# Patient Record
Sex: Male | Born: 1950 | Race: White | Hispanic: No | Marital: Married | Smoking: Former smoker
Health system: Southern US, Community
[De-identification: ages and names within clinical notes are randomized; demographics above are authoritative.]

## PROBLEM LIST (undated history)

## (undated) DIAGNOSIS — I2510 Atherosclerotic heart disease of native coronary artery without angina pectoris: Secondary | ICD-10-CM

## (undated) DIAGNOSIS — E119 Type 2 diabetes mellitus without complications: Secondary | ICD-10-CM

## (undated) DIAGNOSIS — I219 Acute myocardial infarction, unspecified: Secondary | ICD-10-CM

## (undated) HISTORY — PX: CORONARY STENT PLACEMENT: SHX1402

## (undated) HISTORY — PX: TONSILLECTOMY: SUR1361

## (undated) HISTORY — PX: HERNIA REPAIR: SHX51

## (undated) HISTORY — PX: APPENDECTOMY: SHX54

---

## 2000-06-26 ENCOUNTER — Encounter: Admission: RE | Admit: 2000-06-26 | Discharge: 2000-06-26 | Payer: Self-pay | Admitting: Family Medicine

## 2000-06-26 ENCOUNTER — Encounter: Payer: Self-pay | Admitting: Family Medicine

## 2000-06-27 ENCOUNTER — Encounter: Payer: Self-pay | Admitting: Family Medicine

## 2000-06-27 ENCOUNTER — Encounter: Admission: RE | Admit: 2000-06-27 | Discharge: 2000-06-27 | Payer: Self-pay | Admitting: Family Medicine

## 2002-08-13 ENCOUNTER — Emergency Department (HOSPITAL_COMMUNITY): Admission: EM | Admit: 2002-08-13 | Discharge: 2002-08-13 | Payer: Self-pay | Admitting: Emergency Medicine

## 2002-08-13 ENCOUNTER — Encounter: Payer: Self-pay | Admitting: Emergency Medicine

## 2003-10-09 ENCOUNTER — Encounter (HOSPITAL_COMMUNITY): Admission: RE | Admit: 2003-10-09 | Discharge: 2003-11-08 | Payer: Self-pay | Admitting: Preventative Medicine

## 2006-01-22 ENCOUNTER — Inpatient Hospital Stay (HOSPITAL_COMMUNITY): Admission: EM | Admit: 2006-01-22 | Discharge: 2006-01-26 | Payer: Self-pay | Admitting: Emergency Medicine

## 2006-03-01 ENCOUNTER — Inpatient Hospital Stay (HOSPITAL_COMMUNITY): Admission: RE | Admit: 2006-03-01 | Discharge: 2006-03-04 | Payer: Self-pay | Admitting: Cardiology

## 2007-05-10 ENCOUNTER — Encounter: Admission: RE | Admit: 2007-05-10 | Discharge: 2007-05-10 | Payer: Self-pay | Admitting: Family Medicine

## 2007-08-23 ENCOUNTER — Ambulatory Visit (HOSPITAL_COMMUNITY): Admission: RE | Admit: 2007-08-23 | Discharge: 2007-08-23 | Payer: Self-pay | Admitting: Cardiology

## 2007-10-29 ENCOUNTER — Encounter: Admission: RE | Admit: 2007-10-29 | Discharge: 2007-10-29 | Payer: Self-pay | Admitting: Surgery

## 2007-10-30 ENCOUNTER — Ambulatory Visit (HOSPITAL_BASED_OUTPATIENT_CLINIC_OR_DEPARTMENT_OTHER): Admission: RE | Admit: 2007-10-30 | Discharge: 2007-10-30 | Payer: Self-pay | Admitting: Surgery

## 2008-03-30 ENCOUNTER — Encounter: Admission: RE | Admit: 2008-03-30 | Discharge: 2008-03-30 | Payer: Self-pay | Admitting: Plastic Surgery

## 2008-11-26 ENCOUNTER — Encounter: Admission: RE | Admit: 2008-11-26 | Discharge: 2008-11-26 | Payer: Self-pay | Admitting: Sports Medicine

## 2009-08-03 ENCOUNTER — Inpatient Hospital Stay (HOSPITAL_BASED_OUTPATIENT_CLINIC_OR_DEPARTMENT_OTHER): Admission: RE | Admit: 2009-08-03 | Discharge: 2009-08-03 | Payer: Self-pay | Admitting: Cardiology

## 2010-01-12 ENCOUNTER — Encounter: Admission: RE | Admit: 2010-01-12 | Discharge: 2010-01-12 | Payer: Self-pay | Admitting: Orthopaedic Surgery

## 2010-06-05 ENCOUNTER — Encounter: Payer: Self-pay | Admitting: Cardiology

## 2010-06-23 ENCOUNTER — Other Ambulatory Visit: Payer: Self-pay | Admitting: Surgery

## 2010-06-23 DIAGNOSIS — R102 Pelvic and perineal pain: Secondary | ICD-10-CM

## 2010-06-27 ENCOUNTER — Other Ambulatory Visit: Payer: Self-pay

## 2010-06-27 ENCOUNTER — Ambulatory Visit
Admission: RE | Admit: 2010-06-27 | Discharge: 2010-06-27 | Disposition: A | Discharge status: Home / Self Care | Payer: Self-pay | Source: Ambulatory Visit | Attending: Surgery | Admitting: Surgery

## 2010-06-27 DIAGNOSIS — R102 Pelvic and perineal pain: Secondary | ICD-10-CM

## 2010-06-27 MED ORDER — IOHEXOL 300 MG/ML  SOLN
125.0000 mL | Freq: Once | INTRAMUSCULAR | Status: AC | PRN
  Administered 2010-06-27: 125 mL via INTRAVENOUS

## 2010-09-27 NOTE — Op Note (Signed)
NAME:  Jackson Porter, Jackson Porter         ACCOUNT NO.:  [id number]   MEDICAL RECORD NO.:  [id number]          PATIENT TYPE:  AMB   LOCATION:  DSC                          FACILITY:  [facility]   PHYSICIAN:  Wilmon Arms. Corliss Skains, M.D. DATE OF BIRTH:  09-Mar-1951   DATE OF PROCEDURE:  10/30/2007  DATE OF DISCHARGE:                               OPERATIVE REPORT   PREOPERATIVE DIAGNOSES:  1. Right inguinal hernia.  2. Umbilical hernia.   POSTOPERATIVE DIAGNOSES:  1. Right inguinal hernia.  2. Umbilical hernia.   PROCEDURE PERFORMED:  1. Right inguinal hernia repair with mesh.  2. Umbilical hernia repair with mesh.   SURGEON:  Wilmon Arms. Corliss Skains, MD, FACS   ANESTHESIA:  General via LMA.   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  She presents with a one-year  history of pain and discomfort in his right groin.  His pain has become  more uncomfortable over the last few months.  His primary care doctor  obtained a CT scan of the pelvis in December 2008.  This showed an  umbilical hernia containing fat and a small right inguinal hernia  containing only fat.  The patient has no obstructive symptoms.  She  presents now for elective repair.   DESCRIPTION OF PROCEDURE:  The patient is brought to the operating room  and placed in a supine position on the operating room table.  After an  adequate level of general anesthesia was obtained, the periumbilical  region and right groin was shaved, prepped with Betadine and draped in  sterile fashion.  Time-out was taken to assure the proper patient and  proper procedure.  A skin mark was used to outline an incision above the  right inguinal ligament.  This area was infiltrated with 0.25% Marcaine  with epinephrine.  An incision was made and dissection was carried down  to the external oblique fascia.  The external oblique fascia was opened  along the direction of its fibers down to the  external ring.  We bluntly  dissected around the spermatic cord.  The floor of the inguinal canal  showed a moderate size direct hernia.  This was easily reduced.  I  skeletonized the spermatic cord and a small indirect hernia sac was  dissected free and reduced up to the internal ring.  The internal ring  was tightened with a 2-0 Vicryl.  The floor of the inguinal canal was  reapproximated with a 0 PDS suture.  A keyhole mesh was cut from a 3  inch x 6 inch piece of UltraPro mesh.  This was secured beginning at the  pubic tubercle with 2-0 Prolene suture.  Interrupted sutures were used  to attach the mesh to the shelving edge and the internal oblique fascia.  The tails were sutured together behind the spermatic cord and tucked  underneath the external oblique fascia.  The fascia was reapproximated  with 2-0 Vicryl.  3-0 Vicryl was used to close the subcutaneous tissues,  1-0 Monocryl was used to close the skin.  We then covered this  incision  with a towel.  I changed gloves.  We infiltrated the periumbilical  region with 0.25% Marcaine with epinephrine.  I made a transverse  incision above the umbilicus.  Dissection was carried down to the fascia  with cautery.  The umbilical hernia defect was directly behind the  umbilicus and the hernia sac was densely adherent to the posterior  surface of the umbilical dermis.  I had to excise a small amount of  umbilical skin along with part of the hernia sac.  The hernia was  reduced back to the preperitoneal space.  The fascia was cleared for  about a centimeter and half in all directions.  A small Ventralex mesh  was inserted into the preperitoneal space and secured with 4 interrupted  0 Prolene sutures.  The fascia was reapproximated anterior to the mesh  with figure-of-eight 0 Prolene.  3-0 Vicryl was used to tack down the  base of the umbilicus to the fascia.  3-0 Vicryl was used to close the  subcutaneous tissues and 4-0 Monocryl was used to  close the skin.  Steri-  Strips and clean dressings were applied.  The patient was extubated and  brought to the recovery room in stable condition.  All sponge,  instrument, and needle counts were correct.      Wilmon Arms. Tsuei, M.D.  Electronically Signed     MKT/MEDQ  D:  10/30/2007  T:  10/31/2007  Job:  [id number]   cc:   L. Lupe Carney, M.D.

## 2010-09-30 NOTE — Cardiovascular Report (Signed)
NAME:  Jackson Porter, Jackson Porter NO.:  [id number]   MEDICAL RECORD NO.:  [id number]          PATIENT TYPE:  INP   LOCATION:  2032                         FACILITY:  [facility]   PHYSICIAN:  Eduardo Osier. Sharyn Lull, M.D. DATE OF BIRTH:  May 20, 1950   DATE OF PROCEDURE:  01/22/2006  DATE OF DISCHARGE:                              CARDIAC CATHETERIZATION   PROCEDURES:  1. Left cardiac catheterization, selective left and right coronary      angiography, left ventriculography via right groin using Judkins      technique.  2. Successful percutaneous transluminal coronary angioplasty to      posterolateral branch of the right coronary artery using 2.5 x 12-mm      long Voyager balloon.  3. Successful percutaneous transluminal coronary angioplasty to the mid      left anterior descending artery using 2 x 12-mm long Voyager balloon      and then 2.5 x 12-mm long Voyager balloon.  4. Successful deployment of 2.5 x 28-mm long CYPHER drug-eluting stent in      mid left anterior descending artery.  5. Successful post dilatation of this CYPHER drug-eluting stent using 2.75      x 13-mm long Powersail balloon.  6. Successful deployment of 3 x 13-mm long CYPHER drug-eluting stent in      the proximal left anterior descending artery.   INDICATIONS FOR THE PROCEDURES:  Jackson Porter is a 60 year old white  male with past medical history significant for tobacco abuse.  He came to  the ER via EMS complaining of retrosternal chest pain, rated 10/10,  radiating to his left arm, associated with nausea.  Received sublingual  nitro with partial relief associated with mild shortness of breath.  States  had similar chest pain last week lasting half an hour relieved with rest and  did not seek medical attention.  EKG done in the field showed normal sinus  rhythm with a right bundle branch block pattern with left anterior  fascicular block and Q waves in the inferior leads with ST elevation in  inferior  leads and also lateral poor R wave progression.  Due to typical  anginal chest pain, EKG changes, I spoke to the patient and his wife  regarding emergency left cath, possible PTCA stenting and consented for the  procedure.   PROCEDURE:  After obtained the informed consent, the patient was brought to  the cath lab and was placed on the fluoroscopic table.  Right groin was  prepped and draped in the usual fashion.  Xylocaine 2% was used for local  anesthesia in the right groin.  With the help of a thin-walled needle, a 6-  French arterial sheath was placed.  The sheath was aspirated and flushed.  Next, a 6-French left Judkins catheter was advanced over the wire and under  fluoroscopic guidance up to the ascending aorta.  The wire was pulled out.  The catheter was aspirated and connected to the manifold.  The catheter was  further advanced and engaged into left coronary ostium.  Multiple views of  the left system were taken.  Next, the catheter was disengaged and was  pulled out over the wire and was replaced with a 6-French right Judkins  catheter which was advanced over the wire under fluoroscopic guidance to the  ascending aorta. The wire was pulled out. The catheter was aspirated and  connected to the manifold.  The catheter was further advanced and engaged  into the right coronary ostium.  Multiple views of the right system were  taken.  The catheter was disengaged and was pulled out over the wire and was  replaced with 6-French pigtail catheter which was advanced over the wire  under fluoroscopic guidance up to the ascending aorta.  The catheter was  further advanced across the aortic valve into the LV.  LV pressures were  recorded.  LV graft was done in 30 degree RAO position.  Post angiographic  pressures were recorded from LV and then pullback pressures were recorded  from aorta.  There is no gradient across the aortic valve.  The pigtail  catheter was pulled out over the wire.   Sheaths were aspirated and flushed.   FINDINGS:  1. Left ventricle showed inferior wall hypokinesia.  2. Ejection fraction of 50-55%.  3. The left main was patent.  4. The left anterior descending artery had 70% proximal stenosis, and 99%      mid stenosis, and 100% occluded distally which was filling by      collaterals from posterior descending artery.  5. Diagonal-1 was very small.  6. Diagonal-2 was large with end stent to 15% ostial stenosis.  7. The left circumflex has 10-15% proximal stenosis and 50-60% diffuse      disease distally beyond the obtuse marginal-3.  8. Obtuse marginal-1 is very, very small.  9. Obtuse marginal-2 is small which is patent.  10.Obtuse marginal-3 has 90% ostial bifurcation stenosis with distal left      circumflex.  11.Right coronary artery has 40-50% proximal stenosis, and 30% mid      stenosis, and 90-95% stenosis in the posterior left ventricular branch      with haziness which was a culprit lesion for her acute myocardial      infarction.  12.The posterior descending artery was patent proximally which was      providing collaterals to recently occluded left anterior descending      artery.   INTERVENTIONAL PROCEDURES:  1. Successful PTCA to the LV branch of RCA was done using a 2.5 x 12-mm      long Voyager balloon, multiple inflations were done going up to 11      atmospheres pressure.  Lesion was dilated from 90-95% to less than 10%      residual with excellent TIMI grade III distal flow without evidence of      dissection or distal embolization.  2. Successful PTCA of the mid LAD was done using 2 x 12-mm long Voyager      balloon and then 2.5 x 12-mm long Voyager balloon for pre-dilatation,      and then 2.5 x 28-mm long CYPHER drug-eluting stent was deployed at 14      atmospheres of pressure which was fully expanded using 2.75 x 13-mm      long Powersail balloon going up to 18 atmospheres of pressure. 3. Successful direct stenting of the  proximal LAD was done using 3 x 13-mm      long CYPHER drug-eluting stent which was deployed at 11 atmospheres of      pressure which was fully expanded going up to 18 atmospheres of  pressure.  The lesion in the proximal LAD was dilated from 70% to 0%      residual with excellent TIMI grade III distal flow without evidence of      dissection or distal embolization.  4. The patient received weight based heparin, Integrilin, 600 mg of Plavix      during the procedure.  The patient tolerated the procedure well.  There      were no complications.  5. Mid LAD was dilated from 99% to 0% ostial with normalization of distal      flow.           ______________________________  Eduardo Osier Sharyn Lull, M.D.     MNH/MEDQ  D:  01/26/2006  T:  01/27/2006  Job:  [id number]   cc:   Cath Lab

## 2010-09-30 NOTE — Discharge Summary (Signed)
NAME:  Jackson Porter, Jackson Porter         ACCOUNT NO.:  [id number]   MEDICAL RECORD NO.:  [id number]          PATIENT TYPE:  OIB   LOCATION:  3743                         FACILITY:  [facility]   PHYSICIAN:  Mohan N. Sharyn Lull, M.D. DATE OF BIRTH:  November 01, 1950   DATE OF ADMISSION:  03/01/2006  DATE OF DISCHARGE:  03/04/2006                                 DISCHARGE SUMMARY   ADMISSION DIAGNOSES:  1. New onset angina.  2. Coronary artery disease.  3. History of inferior wall and anteroseptal wall myocardial infarction in      the past, status post percutaneous coronary intervention to right      coronary artery and left anterior descending.  4. Multivessel coronary artery disease.  5. Hypertension.  6. Glucose intolerance.  7. Hypercholesteremia.  8. History of tobacco abuse.  9. Resolving thrombocytopenia.   DISCHARGE DIAGNOSES:  1. New onset angina status post percutaneous transluminal coronary      intervention and stenting to left circumflex/ostial obtuse marginal 3.  2. Coronary artery disease status post recent inferior wall myocardial      infarction, status post inferior and anteroseptal wall myocardial      infarction, status post percutaneous coronary intervention to right      coronary artery and left anterior descending.  3. Hypertension.  4. New onset diabetes mellitus controlled by diet.  5. Hypercholesteremia.  6. History of tobacco abuse.  7. Resolving thrombocytopenia.   DISCHARGE MEDICATIONS:  1. Enteric-coated aspirin 325 mg 1 tablet daily for 1 month and then 81 mg      1 tablet daily.  2. Plavix 75 mg 1 tablet daily with food.  3. Toprol-XL 25 mg 1 tablet daily.  4. Altace 2.5 mg 1 capsule daily.  5. Lipitor 40 mg 1 tablet daily.  6. Niaspan 500 mg 2 tablets daily at night.  7. Imdur 30 mg 1 tablet daily in the morning.  8. Nitrostat 0.4 mg sublingual, use as directed.  9. Protonix 40 mg 1 tablet daily.  10.Chantix 1 mg twice daily.   DIET:  Low salt, low  cholesterol, 1800 calorie ADA diet.  The patient has  been advised to avoid sweets.   WOUND CARE:  Post PTCA stent instructions have been given.   ACTIVITY:  The patient has been advised to avoid lifting, pushing or pulling  for 48 hours.   FOLLOW UP:  Follow-up with me in 1 week.   CONDITION AT DISCHARGE:  Stable.   The patient is scheduled for phase II cardiac rehab as outpatient.   BRIEF HISTORY AND HOSPITAL COURSE:  Jackson Porter is a 60 year old white  male with past medical history significant for multivessel coronary artery  disease status post inferior and anteroseptal wall MI requiring PCI to RCA  and 100% occluded LAD, hypertension, glucose intolerance,  hypercholesteremia, history of tobacco abuse.  He was recently discharged  from the hospital status post MI.  He complains of vague retrosternal chest  pain off and on without associated symptoms.  Denies any palpitation,  lightheadedness, or syncope.  Denies PND, orthopnea, leg swelling.  States  that he has not taken any sublingual  nitro since hospital discharge,  although his activity is very limited.   PAST MEDICAL HISTORY:  As above.   PAST SURGICAL HISTORY:  He had appendectomy in the past and tonsillectomy as  a child.   ALLERGIES:  NO KNOWN DRUG ALLERGIES.   MEDICATIONS AT HOME:  1. Enteric-coated aspirin 325 mg daily.  2. Plavix 75 mg p.o. daily.  3. Toprol-XL 25 mg p.o. daily.  4. Altace 2.5 mg p.o. daily.  5. Lipitor 40 mg p.o. daily.  6. Niaspan 500 mg two p.o. q.h.s.  7. Imdur 30 mg p.o. q.a.m.  8. Nitrostat sublingual p.r.n.  9. Chantix 1 mg p.o. b.i.d..   SOCIAL HISTORY:  He is married and has 2 children.  He smoked one pack per  day for 35-40 years, quit after MI just recently.  No history of alcohol  abuse.  He works as an Journalist, newspaper.   FAMILY HISTORY:  Negative for coronary artery disease.   PHYSICAL EXAMINATION:  GENERAL:  He is alert and oriented x3 in no acute  distress.  VITAL  SIGNS:  Blood pressure was 110/76, pulse was 78 and regular.  HEENT:  Conjunctivae was pink.  NECK:  Supple.  No JVD, no bruit.  LUNGS:  Clear to auscultation without rhonchi or rales.  CARDIOVASCULAR:  S1 and S2 was normal.  There was a soft systolic murmur at  the apex.  There was no S3 gallop or rub.  ABDOMEN:  Soft.  Bowel sounds  were present.  Nontender.  EXTREMITIES:  There is no clubbing, cyanosis or edema.   LABORATORY DATA:  EKG showed normal sinus rhythm, right bundle branch block,  old inferior wall MI.  Post-PCI, the EKG showed no acute ischemic changes.   Hemoglobin was 15.4, hematocrit 45.2, white count 8.7, platelet count was  137,000.  His potassium was 4.2, glucose 116, BUN 11, creatinine 1.04.  Post-  PTCA, his CPK was normal at 160, MB of 4.0.  Repeat platelet count was  134,000 which has been stable.  On March 03, 2006, his CPK and CPK-MB were  normal, but troponin I was slightly elevated which was 0.09.  Today's  troponin I is 0.07, CPK-MB is within normal range.   BRIEF HOSPITAL COURSE:  The patient was admitted and underwent left cardiac  cath with selective left and right coronary angiography and PTCA and  stenting to the mid-left circumflex and ostial OM3, as per procedure report.  The patient tolerated the procedure well.  The patient did have calcified  lesion in the mid-left circumflex and takeoff of OM3 was at the right angle  to obtuse angle and had difficulty  tracking the stent down into OM3.   The patient had one episode of mild vague chest discomfort during the  hospital stay.  Repeat EKG showed no acute ischemic changes.  CPK-MB, three  sets have been normal, although troponin I is minimally elevated which has  been trending down.  The patient has been ambulating in hallway without any  problems.  His groin is stable with no evidence of hematoma or bruit.  The patient is off of nitrates for the last 24 hours.  The patient did not have  any more  episodes of chest pain during the hospital stay and will be  discharged home on above medications.  He will be followed up in my office  in 1 week.           ______________________________  Eduardo Osier. Sharyn Lull, M.D.  MNH/MEDQ  D:  03/04/2006  T:  03/05/2006  Job:  [id number]

## 2010-09-30 NOTE — Discharge Summary (Signed)
NAME:  Porter, Jackson NO.:  [id number]   MEDICAL RECORD NO.:  [id number]          PATIENT TYPE:  INP   LOCATION:  2032                         FACILITY:  [facility]   PHYSICIAN:  Eduardo Osier. Sharyn Lull, M.D. DATE OF BIRTH:  Sep 07, 1950   DATE OF ADMISSION:  01/22/2006  DATE OF DISCHARGE:  01/26/2006                                 DISCHARGE SUMMARY   ADMITTING DIAGNOSES:  1. Acute inferior wall myocardial infarction.  2. Tobacco abuse.   FINAL DIAGNOSES:  1. Acute inferior wall myocardial infarction status post percutaneous      coronary intervention to the left ventricular branch of right coronary      artery, status post recent anteroseptal wall myocardial infarction,      status post percutaneous transluminal coronary angioplasty stenting to      left anterior descending, proximal and mid left anterior descending.  2. Hypertension.  3. Glucose intolerance.  4. Hypercholesteremia.  5. Tobacco abuse.  6. Hypercholesteremia, resolving.  7. Thrombocytopenia.   DISCHARGE HOME MEDICATIONS:  1. Enteric-coated aspirin 325 mg 1 tablet daily.  2. Plavix 75 mg 1 tablet daily with food.  3. Toprol-XL 25 mg tablet daily.  4. Altace 2.5 mg 1 capsule daily.  5. Lipitor 40 mg 1 tablet daily.  6. Niaspan 500 mg 2 tablets daily at night.  7. Imdur 30 mg 1 tablet daily in the morning.  8. Nitrostat 0.4 mg sublingual use as directed.  9. Chantix 1 mg half tablet twice daily for one week and then 1 mg twice      daily.   DIET:  Low salt, low cholesterol.  The patient has been advised to avoid  sweets.   DISCHARGE INSTRUCTIONS:  Post PTCA and stent instructions have been given.   FOLLOWUP:  Follow up with me in one week.   CONDITION AT DISCHARGE:  Stable   BRIEF HISTORY AND HOSPITAL COURSE:  [name] is a 60 year old white  male with a past medical history significant for just tobacco abuse.  He  came to the ER via EMS complaining of retrosternal chest pain grade  10/10,  radiating to the left arm associated with nausea, received sublingual nitro  with partial relief associated with also mild shortness of breath.  States  he had similar chest pain last week lasting a half an hour, relieved with  rest, and did not seek any medical attention.  EKG done in the field showed  normal sinus rhythm with right bundle branch block, left anterior fascicular  block, Q-wave inferior leads with ST elevation in inferior leads and lateral  poor R-wave progression.  Due to typical anginal chest pain, EKG changes,  discussed with the patient and his wife regarding emergency cath, possible  PTCA and PTCA stenting, __________ and consented for the procedure.   PAST MEDICAL HISTORY:  As above.   PAST SURGICAL HISTORY:  Had appendectomy in the past and tonsillectomy in  the past.   SOCIAL HISTORY:  He is married and has 2 children.  Smoked one pack per day  for 35-40 years.  No history of alcohol abuse.  He works as an  Psychologist, prison and probation services.   FAMILY HISTORY:  Negative for coronary artery disease.   ALLERGIES:  None.   MEDICATION:  Advil occasionally.   PHYSICAL EXAMINATION:  GENERAL:  He was alert and oriented x3, in no acute  distress.  VITAL SIGNS:  Blood pressure was 124/70, pulse was 76.  HEENT:  Conjunctivae was pink.  NECK:  Supple, no JVD, no bruit.  LUNGS:  Decreased breath sounds at bases with occasional rhonchi.  CARDIOVASCULAR:  S1, S2 was normal.  There was a soft systolic murmur.  There was no S3 gallop.  There was a soft S4 gallop.  ABDOMEN:  Soft.  Bowel sounds present, nontender.  EXTREMITIES: There is no clubbing, cyanosis or edema.   LABORATORIES:  His cholesterol was 188, HDL was low at 29, LDL 132.  Cardiac  enzymes:  CK first set was 260, MB 4.1, second set CK 249, MB 9.1, relative  index 3.7, third set CK 305, MB 13.7, relative index 4.54, fourth set CK  208, MB 7.5, relative index 3.7.  Today's CK is 110, MB 2.1.  Troponin I was  0.02,  0.41, 0.64, 0.40 and 0.19.  His sodium was 139, potassium 3.7,  chloride 109, bicarb 24, glucose was 138.  Repeat fasting blood sugar was  106.  BUN 11, creatinine 1.2.  Hemoglobin was 15.3, hematocrit 45, white  count of 8.7.  His hemoglobin A1c was 5.8.   BRIEF HISTORY AND HOSPITAL COURSE:  The patient was directly taken to the  cath lab and underwent emergency left cardiac cath with select left and  right coronary angiography and PTCA and stenting to the LV branch of RCA and  LAD and PTCA stenting to LAD as per procedure report.  The patient tolerated  the procedure well.  There were no complications.  The patient did not have  any further episodes of chest pain during the hospital stay.  Phase one  cardiac rehab was called.  The patient has been ambulating in the hallway  without any problems.  The patient will be discharged home on the above  medications and will be followed up in my office in one week.  We will  schedule him for elective PCI to left circumflex OM-3 as outpatient in a few  weeks.           ______________________________  Eduardo Osier Sharyn Lull, M.D.     MNH/MEDQ  D:  01/26/2006  T:  01/27/2006  Job:  [id number]

## 2010-09-30 NOTE — Cardiovascular Report (Signed)
NAME:  Jackson Porter, Jackson Porter         ACCOUNT NO.:  [id number]   MEDICAL RECORD NO.:  [id number]          PATIENT TYPE:  OIB   LOCATION:  6526                         FACILITY:  [facility]   PHYSICIAN:  Mohan N. Sharyn Lull, M.D. DATE OF BIRTH:  October 25, 1950   DATE OF PROCEDURE:  03/01/2006  DATE OF DISCHARGE:                              CARDIAC CATHETERIZATION   PROCEDURE:  1. Left cardiac cath with selective left and right coronary angiography,      measurement of left ventricular end diastolic pressure via right groin      using Judkins technique.  2. Successful percutaneous transluminal coronary angiography to mid left      circumflex and ostial third obtuse marginal using initially 1.5 x 12 mm      long Voyager balloon and then 2.5 x 8 mm long Voyager balloon for pre-      dilatation.  3. Successful deployment of 2.5 x 13 mm long Cypher drug eluting stent in      the mid left circumflex leading to ostial third obtuse marginal.  4. Successful post dilatation of this stent using 2.5 x a 8 mm long      PowerSail balloon and then 2.75 x 8 mm long PowerSail balloon going up      to 15-18 atmospheres pressure.   INDICATIONS FOR PROCEDURE:  Mr. 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 Prior to this, the  patient had anteroseptal wall MI, post infarction angina, hypertension,  glucose intolerance, hypercholesteremia, and history of tobacco abuse, who  was recently discharged from the hospital status post MI requiring PCI to  RCA and LAD.  The patient complains of vague retrosternal chest pain off and  on without associated symptoms of nausea, vomiting or diaphoresis.  Denies  any shortness of breath.  Denies palpitation, lightheadedness or syncope.  Denies PND, orthopnea, leg swelling.  Denies taking any sublingual nitro  since hospital discharge, although his  activity is very limited.   PAST MEDICAL HISTORY:  As above.   PAST SURGICAL HISTORY:  He had appendectomy and tonsillectomy in the past.   ALLERGIES:  No known drug allergies.   MEDICATIONS AT HOME:  Enteric coated aspirin 325 mg p.o. daily, Plavix 75 mg  p.o. daily, Toprol XL 25 mg p.o. daily, Altace 2.5 mg p.o. daily, Lipitor 40  mg p.o. daily, Niaspan 500 mg 2 p.o. h.s., Imdur 30 mg p.o. q.a.m., Chantix  1 mg p.o. b.i.d., Nitrostat 0.4 mg sublingual.   SOCIAL HISTORY:  He is married, has two children.  He smoked one pack per  day for 35-40 years, quit after MI last month, no history of alcohol abuse.  He works as an Biomedical scientist.   FAMILY HISTORY:  Negative for coronary artery disease.   PHYSICAL EXAMINATION:  He is alert, awake, and oriented x3 in no acute  distress.  Blood pressure was 110/76, pulse was 78.  Conjunctivae was pink.  Neck supple, no JVD, no bruit.  Lungs were clear to auscultation without  rhonchi or  rales.  Cardiovascular reveals normal S1 and S2, there was a soft  systolic murmur.  There was no S3 gallop.  The abdomen was soft, bowel  sounds were present, nontender.  Extremities reveals no clubbing, cyanosis  or edema.   IMPRESSION:  Onset angina, CAD status post inferior and anteroseptal wall MI  in the past, status post PCI to RCA and 100% occluded LAD, multi-vessel  coronary artery disease, hypertension, glucose intolerance,  hypercholesteremia, history of tobacco abuse, resolving thrombocytopenia.  I  discussed with the patient at length regarding left cath, possible PTCA and  stenting of the left circumflex bifurcation stenosis.  Its risks and  benefits, i.e. death, MI, stroke, need for emergency CABG, restenosis, local  vascular complications, risk of plaque shift and occlusion, and he consented  for the procedure.   PROCEDURE:  After obtaining the informed consent, the patient was brought to  the cath lab and was placed on the fluoroscopy  table.  The right groin was  prepped and draped in the usual fashion.  2% Xylocaine was used for local  anesthesia in the right groin. With the help of a thin-wall needle, 6-French  arterial sheath was placed.  The sheath was aspirated and flushed.  Next, 6  French right Judkins catheter was advanced over the wire under fluoroscopic  guidance up to the ascending aorta.  The wire was pulled out, the catheter  was aspirated and connected to the manifold.  The catheter was further  advanced and engaged into the right coronary ostium.  Multiple views of the  right system were taken.  Next, the catheter was disengaged and was pulled  out over the wire and was replaced with 6-French left Judkins catheter which  was advanced over the wire under fluoroscopic guidance into the ascending  aorta.  The wire was pulled out, the catheter was aspirated and connected to  the manifold.  The catheter was further advanced and engaged into the left  coronary ostium.  Multiple views of the left system were taken.  Next,  catheter was disengaged and was pulled out over the wire.  The sheaths  aspirated and flushed.   FINDINGS:  LVEDP was 8 mmHg.  LV was not done as the patient had LV done  approximately a month ago.  The left main was patent.  The LAD stented proximal segment was patent and has 20-30% proximal and mid  junction stenosis, mid LAD stent is patent, distally the vessel is small and  diffusely diseased.  Left circumflex has 15-25% proximal stenosis and 70% mid stenosis.  OM1 and  OM2 were very small.  OM3 has 85-90% ostial bifurcation stenosis with left  circumflex with almost right angle takeoff.  RCA has 40-50% proximal  stenosis and 20-30% mid and distal stenosis. Distal RCA is patent at prior  PTCA site.   The patient did receive intracoronary nitro prior to PCI.  I reviewed the  films with Dr. Juanda Chance and Dr. Jacinto Halim prior to PCI.  INTERVENTIONAL PROCEDURE:  Successful PTCA to mid left  circumflex and ostial  OM3 was done using initially 1.5 x 12-mm long Voyager balloon using double  wire technique as Voyager balloon will not track on the wire up to ostial  beyond the ostial of 2 and 3.  BMW wire was also advanced without difficulty  into the distal left circumflex. Subsequently, 1.5 x 12-mm long Voyager  balloon was advanced without difficulty up to mid left circumflex and  proximal OM3.  Multiple  inflations were done in the ostial and OM3 and mid  circumflex, initially using 1.5 x 12-mm long Voyager balloon and then PTCA  to the mid left circumflex was done using 2.5 x 8-mm long Voyager balloon as  balloon will not track OM3.  Multiple inflations were done and then PTCA to  the ostial OM3 was done using 2.5 x 8-mm long PowerSail balloon.  Multiple  inflations were done going up to 12-13 mmHg and then attempted to deploy a  2.5 x 13-mm long Cypher drug-eluting stent in left circumflex/OM3 without  success. BMW wire was then pulled back and advanced into OM3 using buddy  wire technique and then attempted 2.5 x 13-mm long Cypher drug-eluting stent  was deployed in left circumflex and ostial OM3 as stent which could not be  advanced or retracted at 15 atmospheres of pressure.  The stent was post  dilated using initially 2.5 x 8-mm long PowerSail balloon and then 2.75 x 8  mm long PowerSail balloon going up to 15 to 18 atmospheres pressure.  Angiogram showed linear dissection at the distal edge of this stent with  TIMI III distal flow.  Then, 2.75 x 15-mm long Voyager balloon was used to  tack up the dissection going up to 6-8 atmospheres pressure.  Multiple  inflations were done with persistent dissection but good TIMI grade III  distal flow.  Then, multiple attempts were made to deploy Cypher drug-  eluting stent, Taxus drug eluting stent, and in the end, the New Vision  stent without success as stent could not be advanced beyond the ostium of  OM3 despite using mailman  buddy wire.  Finally, PTCA to ostial OM3 was done  using 2.75 x 8-mm long PowerSail balloon.  Left circumflex lesion was  dilated from 70% to less than 20% residual and ostial OM3 lesion was dilated  from 85-90% to less than 30% residual with linear distal edge dissection  with TIMI grade III distal flow without evidence of distal embolization.  The patient did not have any EKG changes or chest pain. At the end of the  procedure, the patient received weight based Angiomax and 300 mg of Plavix  during the procedure.  The patient tolerated procedure well.  The patient  was transferred to the recovery room in stable condition. As the patient  received more than 600 mL of IV dye contrast during the procedure, we will  monitor his renal function closely.  The patient remains hemodynamically  stable during the procedure.  The patient tolerated the procedure well and  was transferred to the recovery room in stable condition.          ______________________________  Eduardo Osier Sharyn Lull, M.D.     MNH/MEDQ  D:  03/01/2006  T:  03/02/2006  Job:  [id number]

## 2011-02-09 LAB — COMPREHENSIVE METABOLIC PANEL
ALT: 36
AST: 33
BUN: 11
Calcium: 9.1
Creatinine, Ser: 1.07
GFR calc Af Amer: >60
Glucose, Bld: 96
Sodium: 136
Total Bilirubin: 0.7
Total Protein: 6.6

## 2011-02-09 LAB — CBC
HCT: 44.3
MCHC: 35.7
MCV: 89.5
RBC: 4.96
WBC: 7.4

## 2011-02-09 LAB — DIFFERENTIAL
Basophils Absolute: 0
Eosinophils Absolute: 0
Lymphocytes Relative: 27
Lymphs Abs: 2
Monocytes Relative: 8
Neutrophils Relative %: 64

## 2012-01-16 ENCOUNTER — Encounter (HOSPITAL_COMMUNITY): Payer: Self-pay | Admitting: Emergency Medicine

## 2012-01-16 ENCOUNTER — Emergency Department (HOSPITAL_COMMUNITY): Payer: Self-pay

## 2012-01-16 ENCOUNTER — Emergency Department (HOSPITAL_COMMUNITY)
Admission: EM | Admit: 2012-01-16 | Discharge: 2012-01-16 | Disposition: A | Discharge status: Home / Self Care | Payer: Self-pay | Attending: Emergency Medicine | Admitting: Emergency Medicine

## 2012-01-16 DIAGNOSIS — Z7982 Long term (current) use of aspirin: Secondary | ICD-10-CM | POA: Insufficient documentation

## 2012-01-16 DIAGNOSIS — R7309 Other abnormal glucose: Secondary | ICD-10-CM | POA: Insufficient documentation

## 2012-01-16 DIAGNOSIS — Z9089 Acquired absence of other organs: Secondary | ICD-10-CM | POA: Insufficient documentation

## 2012-01-16 DIAGNOSIS — Z79899 Other long term (current) drug therapy: Secondary | ICD-10-CM | POA: Insufficient documentation

## 2012-01-16 DIAGNOSIS — I2510 Atherosclerotic heart disease of native coronary artery without angina pectoris: Secondary | ICD-10-CM | POA: Insufficient documentation

## 2012-01-16 DIAGNOSIS — R079 Chest pain, unspecified: Secondary | ICD-10-CM | POA: Insufficient documentation

## 2012-01-16 DIAGNOSIS — R739 Hyperglycemia, unspecified: Secondary | ICD-10-CM

## 2012-01-16 HISTORY — DX: Atherosclerotic heart disease of native coronary artery without angina pectoris: I25.10

## 2012-01-16 LAB — CBC WITH DIFFERENTIAL/PLATELET
Basophils Absolute: 0 10*3/uL (ref 0.0–0.1)
Basophils Relative: 0 % (ref 0–1)
Eosinophils Absolute: 0 10*3/uL (ref 0.0–0.7)
Hemoglobin: 15.2 g/dL (ref 13.0–17.0)
Lymphs Abs: 1.4 10*3/uL (ref 0.7–4.0)
MCV: 88.1 fL (ref 78.0–100.0)
Monocytes Absolute: 0.7 10*3/uL (ref 0.1–1.0)
Neutro Abs: 5.5 10*3/uL (ref 1.7–7.7)
Neutrophils Relative %: 72 % (ref 43–77)
RDW: 12.9 % (ref 11.5–15.5)
WBC: 7.7 10*3/uL (ref 4.0–10.5)

## 2012-01-16 LAB — POCT I-STAT TROPONIN I

## 2012-01-16 LAB — BASIC METABOLIC PANEL
BUN: 14 mg/dL (ref 6–23)
GFR calc Af Amer: >90 mL/min (ref >90)
GFR calc non Af Amer: 89 mL/min — ABNORMAL LOW (ref >90)
Potassium: 4.1 mEq/L (ref 3.5–5.1)

## 2012-01-16 NOTE — ED Provider Notes (Signed)
History     CSN: [id number]  Arrival date & time 01/16/12  0457   First MD Initiated Contact with Patient 01/16/12 0507      Chief Complaint  Patient presents with  . Chest Pain    HPI The patient presents with left-sided chest pain.  Symptoms began insidiously approximately one day ago.  Since onset symptoms have been persistent with nausea.  Denies vomiting, diaphoresis or dyspnea.  He notes the symptoms are different from those he experienced during prior MI, which he has several of.  No clear alleviating or exacerbating factors.   Past Medical History  Diagnosis Date  . Coronary artery disease     Past Surgical History  Procedure Date  . Coronary stent placement   . Appendectomy   . Tonsillectomy   . Hernia repair     No family history on file.  History  Substance Use Topics  . Smoking status: Former Games developer  . Smokeless tobacco: Not on file  . Alcohol Use: No      Review of Systems  Constitutional:       Per HPI, otherwise negative  HENT:       Per HPI, otherwise negative  Eyes: Negative.   Respiratory:       Per HPI, otherwise negative  Cardiovascular:       Per HPI, otherwise negative  Gastrointestinal: Negative for vomiting.  Genitourinary: Negative.   Musculoskeletal:       Per HPI, otherwise negative  Skin: Negative.   Neurological: Negative for syncope.    Allergies  Ibuprofen  Home Medications   Current Outpatient Rx  Name Route Sig Dispense Refill  . ASPIRIN 325 MG PO TABS Oral Take 325 mg by mouth daily.    . ATORVASTATIN CALCIUM 40 MG PO TABS Oral Take 40 mg by mouth daily.    Marland Kitchen CLOPIDOGREL BISULFATE 75 MG PO TABS Oral Take 75 mg by mouth daily.    . ISOSORBIDE MONONITRATE ER 30 MG PO TB24 Oral Take 30 mg by mouth daily.    Marland Kitchen METOPROLOL TARTRATE 25 MG PO TABS Oral Take 25 mg by mouth daily.    Marland Kitchen RAMIPRIL 2.5 MG PO CAPS Oral Take 2.5 mg by mouth daily.      BP 116/64  Pulse 68  Temp 97.5 F (36.4 C) (Oral)  Resp 18  SpO2  95%  Physical Exam  Nursing note and vitals reviewed. Constitutional: He is oriented to person, place, and time. He appears well-developed. No distress.  HENT:  Head: Normocephalic and atraumatic.  Eyes: Conjunctivae and EOM are normal.  Cardiovascular: Normal rate and regular rhythm.   Pulmonary/Chest: Effort normal. No stridor. No respiratory distress.  Abdominal: He exhibits no distension.  Musculoskeletal: He exhibits no edema.  Neurological: He is alert and oriented to person, place, and time.  Skin: Skin is warm and dry.  Psychiatric: He has a normal mood and affect.    ED Course  Procedures (including critical care time)   Labs Reviewed  BASIC METABOLIC PANEL  CBC WITH DIFFERENTIAL   Dg Chest 2 View  01/16/2012  *RADIOLOGY REPORT*  Clinical Data: Chest pain, shortness of breath, hypertension, coronary artery disease post stenting  CHEST - 2 VIEW  Comparison: 10/29/2007  Findings: Upper-normal size of cardiac silhouette. Mediastinal contours and pulmonary vascularity normal. Azygos fissure noted. Lungs clear. No pleural effusion or pneumothorax. No acute osseous findings.  IMPRESSION: No acute abnormalities.   Original Report Authenticated By: Lollie Marrow, M.D.  No diagnosis found.    MDM  Cardiac: 70sr, normal  Pulse: 99%ra, normal   Date: 01/16/2012  Rate: 68  Rhythm: normal sinus rhythm  QRS Axis: left  Intervals: normal  ST/T Wave abnormalities: nonspecific ST/T changes  Conduction Disutrbances:right bundle branch block, LAFB  Narrative Interpretation:   Old EKG Reviewed: unchanged ABNORMAL    MDM:     This patient with a history of prior MI, prior stenting now presents with one day of left-sided chest pain.  On exam the patient is in no distress.  Reassuring for the low suspicion of acute cardiovascular compromise.  Given the duration of symptoms, the negative lab results, the absence of distress in the unremarkable vital signs, the patient  elected for discharge with family followup with his cardiologist.  I discussed this with the cardiologist, who is amenable, and the patient was discharged in stable condition.  Gerhard Munch, MD 01/16/12 [phone]

## 2012-01-16 NOTE — ED Notes (Signed)
Old and New EKG given to Dr Lockwood 

## 2012-01-16 NOTE — ED Notes (Signed)
Pt. Reports "feeling funny" starting yesterday.  Stated BP 151/89, took 324 ASA. Reports left sided chest pain radiating to central chest, left jaw, and back. Also reports nausea and slight difficulty breathing. Hx of MI.

## 2012-01-16 NOTE — ED Notes (Signed)
MD at bedside. 

## 2012-01-16 NOTE — ED Notes (Signed)
PT. REPORTS LEFT CHEST PAIN RADIATING TO LEFT AXILLA ONSET YESTERDAY AFTERNOON WITH SLIGHT NAUSEA , DENIES SOB OR DIAPHORESIS , PT. TOOK 1 REGULAR ASA LAST NIGHT , STATES HISTORY OF CAD WITH  STENT, CARDIOLOGIST- DR. Jeoffrey Massed.

## 2013-06-19 ENCOUNTER — Other Ambulatory Visit: Payer: Self-pay | Admitting: Neurosurgery

## 2013-06-19 DIAGNOSIS — M5416 Radiculopathy, lumbar region: Secondary | ICD-10-CM

## 2013-07-01 ENCOUNTER — Inpatient Hospital Stay: Admission: RE | Admit: 2013-07-01 | Payer: Self-pay | Source: Ambulatory Visit

## 2013-07-03 ENCOUNTER — Ambulatory Visit
Admission: RE | Admit: 2013-07-03 | Discharge: 2013-07-03 | Disposition: A | Discharge status: Home / Self Care | Payer: Medicare Other | Source: Ambulatory Visit | Attending: Neurosurgery | Admitting: Neurosurgery

## 2013-07-03 DIAGNOSIS — M5416 Radiculopathy, lumbar region: Secondary | ICD-10-CM

## 2013-07-11 ENCOUNTER — Other Ambulatory Visit: Payer: Medicare Other

## 2013-07-14 ENCOUNTER — Ambulatory Visit
Admission: RE | Admit: 2013-07-14 | Discharge: 2013-07-14 | Disposition: A | Discharge status: Home / Self Care | Payer: Medicare Other | Source: Ambulatory Visit | Attending: Neurosurgery | Admitting: Neurosurgery

## 2015-04-27 ENCOUNTER — Other Ambulatory Visit: Payer: Self-pay | Admitting: Cardiology

## 2015-04-27 DIAGNOSIS — R079 Chest pain, unspecified: Secondary | ICD-10-CM

## 2015-05-03 ENCOUNTER — Ambulatory Visit (HOSPITAL_COMMUNITY): Payer: Medicare Other

## 2015-05-03 ENCOUNTER — Encounter (HOSPITAL_COMMUNITY)
Admission: RE | Admit: 2015-05-03 | Discharge: 2015-05-03 | Disposition: A | Discharge status: Home / Self Care | Payer: Medicare Other | Source: Ambulatory Visit | Attending: Cardiology | Admitting: Cardiology

## 2015-05-03 ENCOUNTER — Encounter (HOSPITAL_COMMUNITY): Payer: Medicare Other

## 2015-05-03 DIAGNOSIS — R079 Chest pain, unspecified: Secondary | ICD-10-CM | POA: Insufficient documentation

## 2015-05-03 MED ORDER — REGADENOSON 0.4 MG/5ML IV SOLN
INTRAVENOUS | Status: AC
  Administered 2015-05-03: 0.4 mg via INTRAVENOUS
  Filled 2015-05-03: qty 5

## 2015-05-03 MED ORDER — TECHNETIUM TC 99M SESTAMIBI GENERIC - CARDIOLITE
10.0000 | Freq: Once | INTRAVENOUS | Status: AC | PRN
  Administered 2015-05-03: 10 via INTRAVENOUS

## 2015-05-03 MED ORDER — REGADENOSON 0.4 MG/5ML IV SOLN
0.4000 mg | Freq: Once | INTRAVENOUS | Status: AC
  Administered 2015-05-03: 0.4 mg via INTRAVENOUS

## 2015-05-03 MED ORDER — TECHNETIUM TC 99M SESTAMIBI GENERIC - CARDIOLITE
30.0000 | Freq: Once | INTRAVENOUS | Status: AC | PRN
  Administered 2015-05-03: 30 via INTRAVENOUS

## 2015-05-04 LAB — NM MYOCAR MULTI W/SPECT W/WALL MOTION / EF
CHL CUP STRESS STAGE 1 DBP: 85 mmHg
CHL CUP STRESS STAGE 1 GRADE: 0 %
CHL CUP STRESS STAGE 1 HR: 60 {beats}/min
CHL CUP STRESS STAGE 1 SBP: 144 mmHg
CHL CUP STRESS STAGE 1 SPEED: 0 mph
CHL CUP STRESS STAGE 3 GRADE: 0 %
CHL CUP STRESS STAGE 3 SPEED: 0 mph
CHL CUP STRESS STAGE 4 GRADE: 0 %
CHL CUP STRESS STAGE 4 HR: 73 {beats}/min
CHL CUP STRESS STAGE 4 SPEED: 0 mph
CSEPEW: 1 METS
CSEPPHR: 73 {beats}/min
Peak BP: 143 mmHg
Percent of predicted max HR: 46 %
Stage 2 Grade: 0 %
Stage 2 HR: 60 {beats}/min
Stage 2 Speed: 0 mph
Stage 3 DBP: 82 mmHg
Stage 3 HR: 81 {beats}/min
Stage 3 SBP: 140 mmHg
Stage 4 DBP: 85 mmHg
Stage 4 SBP: 143 mmHg

## 2016-06-06 ENCOUNTER — Other Ambulatory Visit: Payer: Self-pay | Admitting: Cardiology

## 2016-06-06 DIAGNOSIS — R079 Chest pain, unspecified: Secondary | ICD-10-CM

## 2016-06-14 ENCOUNTER — Ambulatory Visit (HOSPITAL_COMMUNITY)
Admission: RE | Admit: 2016-06-14 | Discharge: 2016-06-14 | Disposition: A | Discharge status: Home / Self Care | Payer: Medicare Other | Source: Ambulatory Visit | Attending: Cardiology | Admitting: Cardiology

## 2016-06-14 DIAGNOSIS — R079 Chest pain, unspecified: Secondary | ICD-10-CM | POA: Insufficient documentation

## 2016-06-14 MED ORDER — TECHNETIUM TC 99M TETROFOSMIN IV KIT
10.0000 | PACK | Freq: Once | INTRAVENOUS | Status: AC | PRN
  Administered 2016-06-14: 10 via INTRAVENOUS

## 2016-06-14 MED ORDER — REGADENOSON 0.4 MG/5ML IV SOLN
INTRAVENOUS | Status: AC
  Administered 2016-06-14: 0.4 mg via INTRAVENOUS
  Filled 2016-06-14: qty 5

## 2016-06-14 MED ORDER — REGADENOSON 0.4 MG/5ML IV SOLN
0.4000 mg | Freq: Once | INTRAVENOUS | Status: AC
  Administered 2016-06-14: 0.4 mg via INTRAVENOUS

## 2016-06-14 MED ORDER — TECHNETIUM TC 99M TETROFOSMIN IV KIT
30.0000 | PACK | Freq: Once | INTRAVENOUS | Status: AC | PRN
Start: 2016-06-14 — End: 2016-06-14
  Administered 2016-06-14: 30 via INTRAVENOUS

## 2017-10-01 ENCOUNTER — Emergency Department (HOSPITAL_COMMUNITY)
Admission: EM | Admit: 2017-10-01 | Discharge: 2017-10-01 | Disposition: A | Discharge status: Home / Self Care | Payer: Medicare Other | Attending: Emergency Medicine | Admitting: Emergency Medicine

## 2017-10-01 ENCOUNTER — Other Ambulatory Visit: Payer: Self-pay

## 2017-10-01 ENCOUNTER — Encounter (HOSPITAL_COMMUNITY): Payer: Self-pay | Admitting: Emergency Medicine

## 2017-10-01 DIAGNOSIS — I2510 Atherosclerotic heart disease of native coronary artery without angina pectoris: Secondary | ICD-10-CM | POA: Diagnosis not present

## 2017-10-01 DIAGNOSIS — Y929 Unspecified place or not applicable: Secondary | ICD-10-CM | POA: Insufficient documentation

## 2017-10-01 DIAGNOSIS — Y9359 Activity, other involving other sports and athletics played individually: Secondary | ICD-10-CM | POA: Diagnosis not present

## 2017-10-01 DIAGNOSIS — S61041A Puncture wound with foreign body of right thumb without damage to nail, initial encounter: Secondary | ICD-10-CM | POA: Insufficient documentation

## 2017-10-01 DIAGNOSIS — Z87891 Personal history of nicotine dependence: Secondary | ICD-10-CM | POA: Insufficient documentation

## 2017-10-01 DIAGNOSIS — Z79899 Other long term (current) drug therapy: Secondary | ICD-10-CM | POA: Insufficient documentation

## 2017-10-01 DIAGNOSIS — Y998 Other external cause status: Secondary | ICD-10-CM | POA: Insufficient documentation

## 2017-10-01 DIAGNOSIS — W458XXA Other foreign body or object entering through skin, initial encounter: Secondary | ICD-10-CM | POA: Insufficient documentation

## 2017-10-01 DIAGNOSIS — Z7982 Long term (current) use of aspirin: Secondary | ICD-10-CM | POA: Diagnosis not present

## 2017-10-01 DIAGNOSIS — Z7902 Long term (current) use of antithrombotics/antiplatelets: Secondary | ICD-10-CM | POA: Insufficient documentation

## 2017-10-01 DIAGNOSIS — S6991XA Unspecified injury of right wrist, hand and finger(s), initial encounter: Secondary | ICD-10-CM

## 2017-10-01 NOTE — ED Triage Notes (Signed)
Patient here with fish hook in his right thumb, past the barb.  No bleeding at this time.

## 2017-10-01 NOTE — Discharge Instructions (Signed)
Keep the wound clean.  Apply topical antibiotic ointment twice a day as needed.  Watch for any signs of infection.  Follow-up as needed.

## 2017-10-01 NOTE — ED Provider Notes (Signed)
[facility] EMERGENCY DEPARTMENT Provider Note   CSN: [id number] Arrival date & time: 10/01/17  2005     History   Chief Complaint Chief Complaint  Patient presents with  . Foreign Body in Skin    HPI Jackson Porter is a 67 y.o. male.  HPI  Jackson Porter is a 67 y.o. male presents to emergency department with complaint of fishhook in the right thumb.  Patient states he was getting a fishhook ready for fishing tomorrow when it pierced his right thumb.  He is unable to get it out due to the barb.  Tetanus is up-to-date.  Reports minimal pain at the site of the hook, otherwise denies any complaints.  No numbness or weakness distally.  Full range of motion of the thumb   Past Medical History:  Diagnosis Date  . Coronary artery disease     There are no active problems to display for this patient.   Past Surgical History:  Procedure Laterality Date  . APPENDECTOMY    . CORONARY STENT PLACEMENT    . HERNIA REPAIR    . TONSILLECTOMY          Home Medications    Prior to Admission medications   Medication Sig Start Date End Date Taking? Authorizing Provider  aspirin 325 MG tablet Take 325 mg by mouth daily.    [provider]  atorvastatin (LIPITOR) 40 MG tablet Take 40 mg by mouth daily.    [provider]  clopidogrel (PLAVIX) 75 MG tablet Take 75 mg by mouth daily.    [provider]  isosorbide mononitrate (IMDUR) 30 MG 24 hr tablet Take 30 mg by mouth daily.    [provider]  metoprolol tartrate (LOPRESSOR) 25 MG tablet Take 25 mg by mouth daily.    [provider]  ramipril (ALTACE) 2.5 MG capsule Take 2.5 mg by mouth daily.    [provider]    Family History No family history on file.  Social History Social History   Tobacco Use  . Smoking status: Former Games developer  . Smokeless tobacco: Never Used  Substance Use Topics  . Alcohol use: No  . Drug use: No      Allergies   Ibuprofen   Review of Systems Review of Systems  Skin: Positive for wound.  Neurological: Negative for weakness and numbness.     Physical Exam Updated Vital Signs BP (!) 153/84 (BP Location: Left Arm)   Pulse 85   Temp 98.1 F (36.7 C) (Oral)   Resp 17   SpO2 100%   Physical Exam  Constitutional: He appears well-developed and well-nourished. No distress.  Eyes: Conjunctivae are normal.  Neck: Neck supple.  Cardiovascular: Normal rate.  Pulmonary/Chest: No respiratory distress.  Abdominal: He exhibits no distension.  Musculoskeletal:  Barb of the fishhook in the skin of the right thumb.  Capillary refill less than 2 seconds distally.  Full range of motion of the thumb at each joint  Skin: Skin is warm and dry.  Nursing note and vitals reviewed.    ED Treatments / Results  Labs (all labs ordered are listed, but only abnormal results are displayed) Labs Reviewed - No data to display  EKG None  Radiology No results found.  Procedures .Foreign Body Removal Date/Time: 10/01/2017 8:31 PM Performed by: Jaynie Crumble, PA-C Authorized by: Jaynie Crumble, PA-C  Consent: Verbal consent obtained. Written consent not obtained. Risks and benefits: risks, benefits and alternatives were discussed Consent  given by: patient Patient understanding: patient states understanding of the procedure being performed Patient consent: the patient's understanding of the procedure matches consent given Procedure consent: procedure consent matches procedure scheduled Site marked: the operative site was marked Patient identity confirmed: verbally with patient and arm band Body area: skin Anesthesia: local infiltration  Anesthesia: Local Anesthetic: lidocaine 2% with epinephrine Anesthetic total: 2 mL  Sedation: Patient sedated: no  Patient restrained: no Patient cooperative: yes Localization method: visualized Dressing: antibiotic ointment and  dressing applied Tendon involvement: none Depth: subcutaneous Complexity: simple 1 objects recovered. Objects recovered: fish hook Post-procedure assessment: foreign body removed   (including critical care time)  Medications Ordered in ED Medications - No data to display   Initial Impression / Assessment and Plan / ED Course  I have reviewed the triage vital signs and the nursing notes.  Pertinent labs & imaging results that were available during my care of the patient were reviewed by me and considered in my medical decision making (see chart for details).     Patient with a barbed fishhook in the right thumb.  After numbing the area with lidocaine, was able to pull out the hook in the barb, I did open up the laceration approximately 2 more millimeters with a 11 blade scalpel.  I was unable to push it took all the way through because it entered the skin at 90 degrees angle.  I was however able to pull it out after making a tiny incision with that scalpel.  Wound scrubbed and irrigated thoroughly.  Dressing applied.  The fishhook was clean and was not used for fishing yet.  I do not think he needs any antibiotics.  We will have him follow-up with his doctor as needed.  Instructed to watch for any signs of infection.   Vitals:   10/01/17 2024  BP: (!) 153/84  Pulse: 85  Resp: 17  Temp: 98.1 F (36.7 C)  TempSrc: Oral  SpO2: 100%     Final Clinical Impressions(s) / ED Diagnoses   Final diagnoses:  Fish hook injury of finger of right hand, initial encounter    ED Discharge Orders    None       Iona Coach 10/01/17 2035    Marily Memos, MD 10/02/17 [phone]

## 2018-03-09 ENCOUNTER — Other Ambulatory Visit: Payer: Self-pay | Admitting: Nurse Practitioner

## 2018-03-09 DIAGNOSIS — Z87891 Personal history of nicotine dependence: Secondary | ICD-10-CM

## 2018-03-15 ENCOUNTER — Ambulatory Visit
Admission: RE | Admit: 2018-03-15 | Discharge: 2018-03-15 | Disposition: A | Discharge status: Home / Self Care | Payer: Medicare Other | Source: Ambulatory Visit | Attending: Nurse Practitioner | Admitting: Nurse Practitioner

## 2018-03-15 DIAGNOSIS — Z87891 Personal history of nicotine dependence: Secondary | ICD-10-CM

## 2018-09-17 ENCOUNTER — Other Ambulatory Visit: Payer: Self-pay | Admitting: Nurse Practitioner

## 2018-09-17 DIAGNOSIS — R1030 Lower abdominal pain, unspecified: Secondary | ICD-10-CM

## 2018-09-20 ENCOUNTER — Ambulatory Visit
Admission: RE | Admit: 2018-09-20 | Discharge: 2018-09-20 | Disposition: A | Discharge status: Home / Self Care | Payer: Medicare Other | Source: Ambulatory Visit | Attending: Nurse Practitioner | Admitting: Nurse Practitioner

## 2018-09-20 ENCOUNTER — Other Ambulatory Visit: Payer: Self-pay

## 2018-09-20 DIAGNOSIS — R1030 Lower abdominal pain, unspecified: Secondary | ICD-10-CM

## 2018-09-20 MED ORDER — IOPAMIDOL (ISOVUE-300) INJECTION 61%
100.0000 mL | Freq: Once | INTRAVENOUS | Status: AC | PRN
  Administered 2018-09-20: 100 mL via INTRAVENOUS

## 2018-09-26 ENCOUNTER — Other Ambulatory Visit: Payer: Medicare Other

## 2018-09-30 ENCOUNTER — Other Ambulatory Visit: Payer: Self-pay | Admitting: Nurse Practitioner

## 2018-12-14 ENCOUNTER — Emergency Department (HOSPITAL_COMMUNITY)
Admission: EM | Admit: 2018-12-14 | Discharge: 2018-12-14 | Disposition: A | Discharge status: Home / Self Care | Payer: Medicare Other | Attending: Emergency Medicine | Admitting: Emergency Medicine

## 2018-12-14 ENCOUNTER — Encounter (HOSPITAL_COMMUNITY): Payer: Self-pay

## 2018-12-14 ENCOUNTER — Emergency Department (HOSPITAL_COMMUNITY): Payer: Medicare Other

## 2018-12-14 ENCOUNTER — Other Ambulatory Visit: Payer: Self-pay

## 2018-12-14 DIAGNOSIS — Z79899 Other long term (current) drug therapy: Secondary | ICD-10-CM | POA: Diagnosis not present

## 2018-12-14 DIAGNOSIS — Y999 Unspecified external cause status: Secondary | ICD-10-CM | POA: Insufficient documentation

## 2018-12-14 DIAGNOSIS — Z87891 Personal history of nicotine dependence: Secondary | ICD-10-CM | POA: Insufficient documentation

## 2018-12-14 DIAGNOSIS — Y92008 Other place in unspecified non-institutional (private) residence as the place of occurrence of the external cause: Secondary | ICD-10-CM | POA: Diagnosis not present

## 2018-12-14 DIAGNOSIS — I2510 Atherosclerotic heart disease of native coronary artery without angina pectoris: Secondary | ICD-10-CM | POA: Diagnosis not present

## 2018-12-14 DIAGNOSIS — Y9389 Activity, other specified: Secondary | ICD-10-CM | POA: Insufficient documentation

## 2018-12-14 DIAGNOSIS — S61213A Laceration without foreign body of left middle finger without damage to nail, initial encounter: Secondary | ICD-10-CM | POA: Insufficient documentation

## 2018-12-14 DIAGNOSIS — Z7982 Long term (current) use of aspirin: Secondary | ICD-10-CM | POA: Diagnosis not present

## 2018-12-14 DIAGNOSIS — Z886 Allergy status to analgesic agent status: Secondary | ICD-10-CM | POA: Diagnosis not present

## 2018-12-14 DIAGNOSIS — Z23 Encounter for immunization: Secondary | ICD-10-CM | POA: Diagnosis not present

## 2018-12-14 DIAGNOSIS — R030 Elevated blood-pressure reading, without diagnosis of hypertension: Secondary | ICD-10-CM | POA: Diagnosis not present

## 2018-12-14 DIAGNOSIS — W268XXA Contact with other sharp object(s), not elsewhere classified, initial encounter: Secondary | ICD-10-CM | POA: Diagnosis not present

## 2018-12-14 DIAGNOSIS — Z955 Presence of coronary angioplasty implant and graft: Secondary | ICD-10-CM | POA: Diagnosis not present

## 2018-12-14 MED ORDER — DOXYCYCLINE HYCLATE 100 MG PO CAPS: 100.0000 mg | ORAL_CAPSULE | Freq: Two times a day (BID) | ORAL | 0 refills | Status: DC

## 2018-12-14 MED ORDER — LIDOCAINE HCL 2 % IJ SOLN
5.0000 mL | Freq: Once | INTRAMUSCULAR | Status: AC
  Administered 2018-12-14: 100 mg
  Filled 2018-12-14: qty 20

## 2018-12-14 MED ORDER — TETANUS-DIPHTH-ACELL PERTUSSIS 5-2.5-18.5 LF-MCG/0.5 IM SUSP
0.5000 mL | Freq: Once | INTRAMUSCULAR | Status: AC
  Administered 2018-12-14: 0.5 mL via INTRAMUSCULAR
  Filled 2018-12-14: qty 0.5

## 2018-12-14 NOTE — Discharge Instructions (Addendum)
Please see the information and instructions below regarding your visit.  Your diagnoses today include:  1. Laceration of left middle finger without foreign body without damage to nail, initial encounter   2. Elevated blood pressure reading     Tests performed today include: X-ray of the affected area that did not show any foreign bodies or broken bones Vital signs. See below for your results today.   Medications prescribed:   Take any prescribed medications only as directed.  Tylenol every 6 hours as needed for pain.  Do not exceed 4000 mg in 1 day.  Doxycycline is an antibiotic that fights infection in the skin. This medication can make your skin sensitive to the sun, so please ensure that you wear sunscreen, hats, or other coverage over your skin while taking this. This medicine CANNOT be taken by women while pregnant, breastfeeding, or trying to become pregnant.  Please speak with a healthcare provider if any of these situations apply to you.   Home care instructions:  Follow any educational materials and wound care instructions contained in this packet.   Keep affected area above the level of your heart when possible to minimize swelling. Wash area gently twice a day with warm soapy water. Do not apply alcohol or hydrogen peroxide directly over a wound. Cover the area if it is draining or weeping. Keep the bandage in place for 24 hours and refrain from getting the wound wet for 24 hours. After that, you may get the area wet, but please ensure that you dry it completely afterwards.  Please refrain from soaking sutures for long periods of time, or swimming in chlorinated water   You may apply antibiotic ointment such as Bacitracin or Neosporin.  Follow-up instructions: Suture Removal: Return to the Emergency Department or see your primary care care doctor in 7 days for a recheck of your wound and removal of your sutures or staples.    Return instructions:  Return to the Emergency  Department if you have: Fever Worsening pain Worsening swelling of the wound Pus draining from the wound Redness of the skin that moves away from the wound, especially if it streaks away from the affected area  Any other emergent concerns  Your vital signs today were: BP (!) 145/58    Pulse 65    Temp 98.1 F (36.7 C) (Oral)    Resp 18    SpO2 97%  If your blood pressure (BP) was elevated on multiple readings during this visit above 130 for the top number or above 80 for the bottom number, please have this repeated by your primary care provider within one month. --------------  Thank you for allowing [place] to participate in your care today! It was a pleasure taking care of you.

## 2018-12-14 NOTE — ED Notes (Signed)
ED Provider at bedside. 

## 2018-12-14 NOTE — ED Provider Notes (Addendum)
[facility]   CSN: [id number] Arrival date & time: 12/14/18  [id number]     History   Chief Complaint Chief Complaint  Patient presents with  . laceration/ left middle finger    HPI Jackson Porter is a 68 y.o. male.     HPI   Patient is a 89 male with past medical history of T2 DM on metformin, CAD status post PCI on dual antiplatelet therapy presenting for laceration to the PIP joint of the left middle finger.  Patient reports that he was working in his garage yesterday and was pulling some boards apart when he lacerated the left middle finger.  He is unsure if it was lacerated by a wood or a nail.  He reports that he thought he had bleeding under control but is continued to ooze.  Denies any loss of sensation.  He reports it is painful to flex and extend the finger.  Unsure of last tetanus shot.  Past Medical History:  Diagnosis Date  . Coronary artery disease     There are no active problems to display for this patient.   Past Surgical History:  Procedure Laterality Date  . APPENDECTOMY    . CORONARY STENT PLACEMENT    . HERNIA REPAIR    . TONSILLECTOMY          Home Medications    Prior to Admission medications   Medication Sig Start Date End Date Taking? Authorizing Provider  aspirin 325 MG tablet Take 325 mg by mouth daily.    [provider]  atorvastatin (LIPITOR) 40 MG tablet Take 40 mg by mouth daily.    [provider]  clopidogrel (PLAVIX) 75 MG tablet Take 75 mg by mouth daily.    [provider]  isosorbide mononitrate (IMDUR) 30 MG 24 hr tablet Take 30 mg by mouth daily.    [provider]  metoprolol tartrate (LOPRESSOR) 25 MG tablet Take 25 mg by mouth daily.    [provider]  ramipril (ALTACE) 2.5 MG capsule Take 2.5 mg by mouth daily.    [provider]    Family History No family history on file.  Social History Social History    Tobacco Use  . Smoking status: Former Games developermoker  . Smokeless tobacco: Never Used  Substance Use Topics  . Alcohol use: No  . Drug use: No     Allergies   Ibuprofen   Review of Systems Review of Systems  Musculoskeletal: Positive for arthralgias.  Skin: Positive for wound.  Neurological: Negative for weakness and numbness.     Physical Exam Updated Vital Signs BP (!) 145/58   Pulse 65   Temp 98.1 F (36.7 C) (Oral)   Resp 18   SpO2 97%   Physical Exam Vitals signs and nursing note reviewed.  Constitutional:      General: He is not in acute distress.    Appearance: He is well-developed. He is not diaphoretic.     Comments: Sitting comfortably in bed.  HENT:     Head: Normocephalic and atraumatic.  Eyes:     General:        Right eye: No discharge.        Left eye: No discharge.     Conjunctiva/sclera: Conjunctivae normal.     Comments: EOMs normal to gross examination.  Neck:     Musculoskeletal: Normal range of motion.  Cardiovascular:     Rate and Rhythm: Normal rate  and regular rhythm.     Comments: Intact, 2+ left radial pulse. Abdominal:     General: There is no distension.  Musculoskeletal: Normal range of motion.        General: Tenderness and signs of injury present.     Comments: Patient is a 1 cm superficial laceration overlying the left PIP joint.  No surrounding erythema. Mild swelling around laceration. Hemostatic on exam.  No underlying tendon exposure.  Patient is able to flex and extend the finger although he reports this is painful. Full sensation distally. Capillary refill < 2 seconds.   Skin:    General: Skin is warm and dry.  Neurological:     Mental Status: He is alert.     Comments: Cranial nerves intact to gross observation. Patient moves extremities without difficulty.  Psychiatric:        Behavior: Behavior normal.        Thought Content: Thought content normal.        Judgment: Judgment normal.        ED Treatments / Results   Labs (all labs ordered are listed, but only abnormal results are displayed) Labs Reviewed - No data to display  EKG None  Radiology No results found.  Procedures .Marland KitchenLaceration Repair  Date/Time: 12/14/2018 11:46 AM Performed by: Albesa Seen, PA-C Authorized by: Albesa Seen, PA-C   Consent:    Consent obtained:  Verbal   Consent given by:  Patient   Risks discussed:  Infection and pain   Alternatives discussed:  No treatment Anesthesia (see MAR for exact dosages):    Anesthesia method:  Local infiltration   Local anesthetic:  Lidocaine 2% w/o epi Laceration details:    Location:  Finger   Finger location:  L long finger   Length (cm):  1 Repair type:    Repair type:  Simple Pre-procedure details:    Preparation:  Patient was prepped and draped in usual sterile fashion Exploration:    Hemostasis achieved with:  Direct pressure   Wound exploration: wound explored through full range of motion     Wound extent: no nerve damage noted, no tendon damage noted and no underlying fracture noted   Treatment:    Area cleansed with:  Betadine   Amount of cleaning:  Standard   Irrigation solution:  Sterile saline   Irrigation method:  Syringe Skin repair:    Repair method:  Sutures   Suture size:  4-0   Wound skin closure material used: Vicryl rapide.   Suture technique:  Simple interrupted   Number of sutures:  1 Approximation:    Approximation:  Close Post-procedure details:    Dressing:  Non-adherent dressing   Patient tolerance of procedure:  Tolerated well, no immediate complications   (including critical care time)  Medications Ordered in ED Medications  lidocaine (XYLOCAINE) 2 % (with pres) injection 100 mg (has no administration in time range)  Tdap (BOOSTRIX) injection 0.5 mL (0.5 mLs Intramuscular Given 12/14/18 1058)     Initial Impression / Assessment and Plan / ED Course  I have reviewed the triage vital signs and the nursing notes.  Pertinent  labs & imaging results that were available during my care of the patient were reviewed by me and considered in my medical decision making (see chart for details).        Patient is a 63-year male past medical history of type 2 diabetes mellitus and CAD on ASA presenting for laceration to the PIP joint  of the left middle finger.  No underlying nerve or tendon injury identified. Patient is presenting 18 to 19 hours after the initial injury.  This wound is at risk for wound infection.  Will obtain radiograph, copiously clean, close loosely, and apply pressure dressing.  Will place patient on prophylactic antibiotics.  Radiograph, reviewed by me without evidence of foreign body or underlying fracture.  Laceration repair performed successfully.  Wound hemostatic.  Nonadherent dressing placed.  Patient placed on prophylactic antibiotics.  He is instructed to follow-up in 1 week for suture removal and wound check.  Return precautions given for any increasing pain, swelling, redness, or purulent drainage.  Patient is in understanding and agrees with plan of care.  Image reviewed with and care discussed with Dr. Eber HongBrian Miller, attending physician.  Final Clinical Impressions(s) / ED Diagnoses   Final diagnoses:  Laceration of left middle finger without foreign body without damage to nail, initial encounter  Elevated blood pressure reading    ED Discharge Orders         Ordered    doxycycline (VIBRAMYCIN) 100 MG capsule  2 times daily     12/14/18 [address]49            Masey Scheiber B, PA-C 12/14/18 1941    Eber HongMiller, Brian, MD 12/17/18 2012

## 2018-12-14 NOTE — ED Notes (Signed)
Xray tech at bedside.

## 2018-12-14 NOTE — ED Notes (Signed)
Patient verbalizes understanding of discharge instructions. Opportunity for questioning and answers were provided. Armband removed by staff, pt discharged from ED.  

## 2018-12-14 NOTE — ED Triage Notes (Signed)
Patient here with laceration to left hand middle finger yesterday afternoon, cut on garage door, no bleeding through dressing

## 2018-12-14 NOTE — ED Notes (Signed)
Pt's wound on left middle finger cleansed with dermal cleanser, applied bacitracin, and wrapped in non adhering pad.

## 2019-02-27 ENCOUNTER — Encounter: Payer: Self-pay | Admitting: Podiatry

## 2019-02-27 ENCOUNTER — Ambulatory Visit: Payer: Medicare Other | Admitting: Podiatry

## 2019-02-27 ENCOUNTER — Other Ambulatory Visit: Payer: Self-pay

## 2019-02-27 ENCOUNTER — Ambulatory Visit (INDEPENDENT_AMBULATORY_CARE_PROVIDER_SITE_OTHER): Payer: Medicare Other

## 2019-02-27 VITALS — BP 129/62 | HR 70 | Resp 16

## 2019-02-27 DIAGNOSIS — G8911 Acute pain due to trauma: Secondary | ICD-10-CM | POA: Diagnosis not present

## 2019-02-27 DIAGNOSIS — E1142 Type 2 diabetes mellitus with diabetic polyneuropathy: Secondary | ICD-10-CM

## 2019-02-27 DIAGNOSIS — M545 Low back pain, unspecified: Secondary | ICD-10-CM

## 2019-02-27 DIAGNOSIS — J209 Acute bronchitis, unspecified: Secondary | ICD-10-CM | POA: Insufficient documentation

## 2019-02-27 NOTE — Progress Notes (Signed)
  Subjective:  Patient ID: Jackson Porter, male    DOB: 09/06/1950,  MRN: [id number] HPI Chief Complaint  Patient presents with  . Toe Pain    Toes (tip) left - aching x 6-7 months, mainly 2nd and 3rd toes, no injury, does have back problems and wnders if related, also diabetic last a1c was 7.?  . New Patient (Initial Visit)    68 y.o. male presents with the above complaint.   ROS: Denies fever chills nausea vomiting muscle aches pains calf pain back pain chest pain shortness of breath does relate chronic lower back issues including history of surgery.  Past Medical History:  Diagnosis Date  . Coronary artery disease    Past Surgical History:  Procedure Laterality Date  . APPENDECTOMY    . CORONARY STENT PLACEMENT    . HERNIA REPAIR    . TONSILLECTOMY      Current Outpatient Medications:  .  ibuprofen (ADVIL) 200 MG tablet, Take 200 mg by mouth every 6 (six) hours as needed., Disp: , Rfl:  .  Specialty Vitamins Products (PROSTATE PO), Take by mouth., Disp: , Rfl:  .  VITAMIN D PO, Take by mouth., Disp: , Rfl:  .  aspirin 325 MG tablet, Take 325 mg by mouth daily., Disp: , Rfl:  .  JARDIANCE 10 MG TABS tablet, , Disp: , Rfl:  .  metFORMIN (GLUCOPHAGE) 1000 MG tablet, , Disp: , Rfl:  .  metoprolol succinate (TOPROL-XL) 50 MG 24 hr tablet, , Disp: , Rfl:  .  ramipril (ALTACE) 2.5 MG capsule, Take 2.5 mg by mouth daily., Disp: , Rfl:  .  rosuvastatin (CRESTOR) 20 MG tablet, , Disp: , Rfl:   Allergies  Allergen Reactions  . Ibuprofen Other (See Comments)    unknown   Review of Systems Objective:   Vitals:   02/27/19 0937  BP: 129/62  Pulse: 70  Resp: 16    General: Well developed, nourished, in no acute distress, alert and oriented x3   Dermatological: Skin is warm, dry and supple bilateral. Nails x 10 are well maintained; remaining integument appears unremarkable at this time. There are no open sores, no preulcerative lesions, no rash or signs of infection  present.  Vascular: Dorsalis Pedis artery and Posterior Tibial artery pedal pulses are 2/4 bilateral with immedate capillary fill time. Pedal hair growth present. No varicosities and no lower extremity edema present bilateral.   Neruologic: Grossly intact via light touch bilateral. Vibratory intact via tuning fork bilateral. Protective threshold with Semmes Wienstein monofilament intact to all pedal sites bilateral. Patellar and Achilles deep tendon reflexes 2+ bilateral. No Babinski or clonus noted bilateral.   Musculoskeletal: No gross boney pedal deformities bilateral. No pain, crepitus, or limitation noted with foot and ankle range of motion bilateral. Muscular strength 5/5 in all groups tested bilateral.  Gait: Unassisted, Nonantalgic.    Radiographs:  Radiographs taken today do not demonstrate any acute abnormalities of the osseous mature structure.  Assessment & Plan:   Assessment: Numbness and tenderness to the tip of the toe is nonreproducible on palpation or range of motion  Plan: Most likely radiculopathy of the back.     Jackson Porter, [place]

## 2019-04-13 ENCOUNTER — Emergency Department (HOSPITAL_COMMUNITY): Payer: Medicare Other

## 2019-04-13 ENCOUNTER — Emergency Department (HOSPITAL_COMMUNITY)
Admission: EM | Admit: 2019-04-13 | Discharge: 2019-04-13 | Disposition: A | Discharge status: Home / Self Care | Payer: Medicare Other | Attending: Emergency Medicine | Admitting: Emergency Medicine

## 2019-04-13 ENCOUNTER — Other Ambulatory Visit: Payer: Self-pay

## 2019-04-13 ENCOUNTER — Encounter (HOSPITAL_COMMUNITY): Payer: Self-pay | Admitting: Pharmacy Technician

## 2019-04-13 DIAGNOSIS — E119 Type 2 diabetes mellitus without complications: Secondary | ICD-10-CM | POA: Insufficient documentation

## 2019-04-13 DIAGNOSIS — Y999 Unspecified external cause status: Secondary | ICD-10-CM | POA: Insufficient documentation

## 2019-04-13 DIAGNOSIS — Z7982 Long term (current) use of aspirin: Secondary | ICD-10-CM | POA: Diagnosis not present

## 2019-04-13 DIAGNOSIS — Z87891 Personal history of nicotine dependence: Secondary | ICD-10-CM | POA: Diagnosis not present

## 2019-04-13 DIAGNOSIS — Y92838 Other recreation area as the place of occurrence of the external cause: Secondary | ICD-10-CM | POA: Insufficient documentation

## 2019-04-13 DIAGNOSIS — Y9389 Activity, other specified: Secondary | ICD-10-CM | POA: Insufficient documentation

## 2019-04-13 DIAGNOSIS — S59911A Unspecified injury of right forearm, initial encounter: Secondary | ICD-10-CM | POA: Diagnosis present

## 2019-04-13 DIAGNOSIS — W01198A Fall on same level from slipping, tripping and stumbling with subsequent striking against other object, initial encounter: Secondary | ICD-10-CM | POA: Insufficient documentation

## 2019-04-13 DIAGNOSIS — W19XXXA Unspecified fall, initial encounter: Secondary | ICD-10-CM

## 2019-04-13 DIAGNOSIS — Z79899 Other long term (current) drug therapy: Secondary | ICD-10-CM | POA: Insufficient documentation

## 2019-04-13 DIAGNOSIS — S53491A Other sprain of right elbow, initial encounter: Secondary | ICD-10-CM | POA: Insufficient documentation

## 2019-04-13 DIAGNOSIS — S53401A Unspecified sprain of right elbow, initial encounter: Secondary | ICD-10-CM

## 2019-04-13 DIAGNOSIS — Z7984 Long term (current) use of oral hypoglycemic drugs: Secondary | ICD-10-CM | POA: Diagnosis not present

## 2019-04-13 HISTORY — DX: Type 2 diabetes mellitus without complications: E11.9

## 2019-04-13 MED ORDER — OXYCODONE-ACETAMINOPHEN 5-325 MG PO TABS
1.0000 | ORAL_TABLET | Freq: Once | ORAL | Status: AC
  Administered 2019-04-13: 1 via ORAL
  Filled 2019-04-13: qty 1
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
## 2019-05-23 IMAGING — CT CT ABDOMEN AND PELVIS WITH CONTRAST
1 of 3 series · 13 of 32 positions shown, 19 images · IV contrast (APPLIED)
Comparison: 06/27/2010

CLINICAL DATA: Chronic lower abdominal pain for several years.
Abdominal aortic aneurysm.

EXAM:
CT ABDOMEN AND PELVIS WITH CONTRAST
TECHNIQUE: Multidetector CT imaging of the abdomen and pelvis was performed
using the standard protocol following bolus administration of
intravenous contrast.
CONTRAST:  100mL YKOU1C-9BB IOPAMIDOL (YKOU1C-9BB) INJECTION 61%

[Series 2: abd/pelvis w/cm · axial · 0.87mm/px · z∈[-468,-38]mm · 13 of 100 slices shown, 19 images]
[im 7/100  soft-tissue]
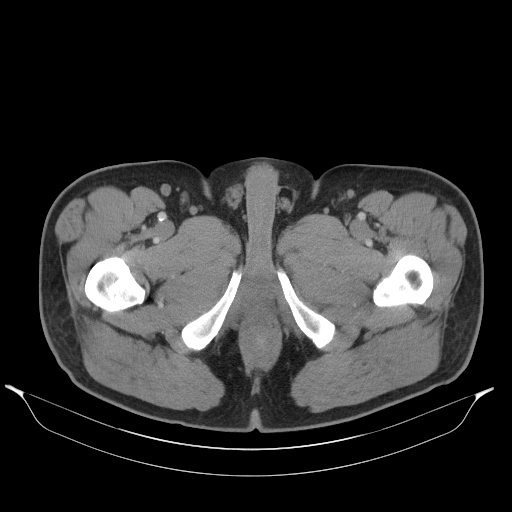
[im 7/100  bone]
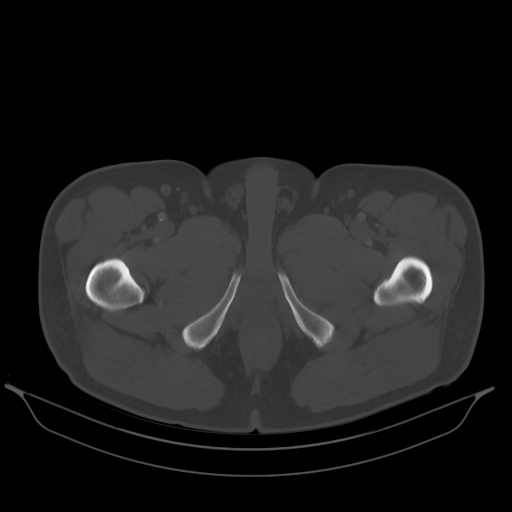
[im 14/100  soft-tissue]
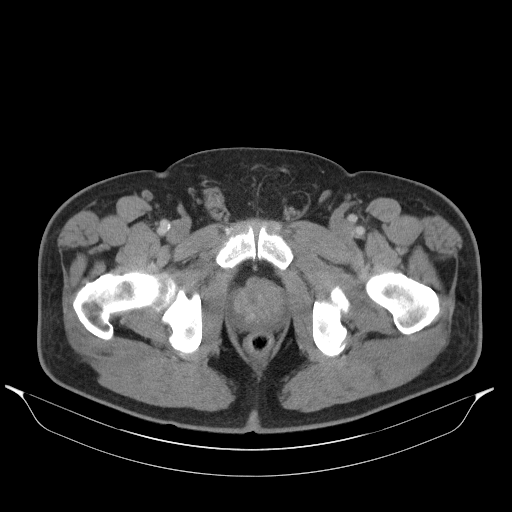
[im 20/100  soft-tissue]
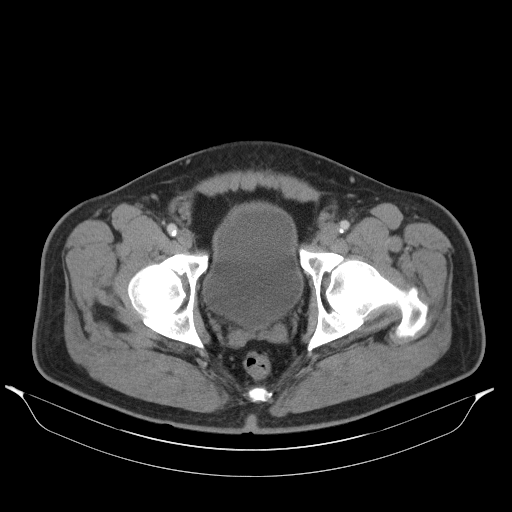
[im 27/100  soft-tissue]
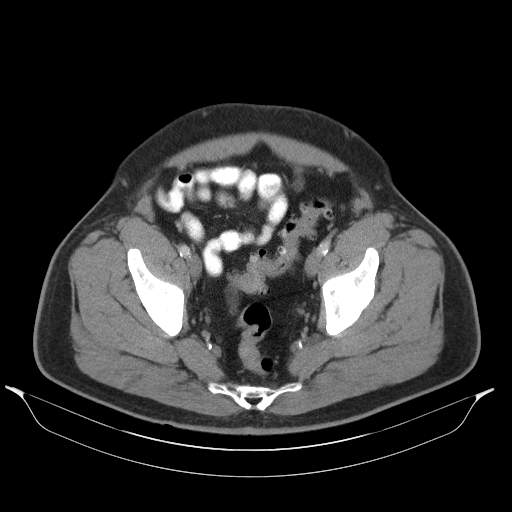
[im 34/100  soft-tissue]
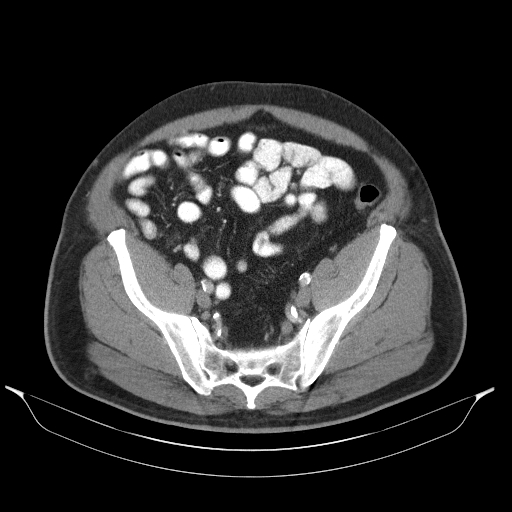
[im 40/100  soft-tissue]
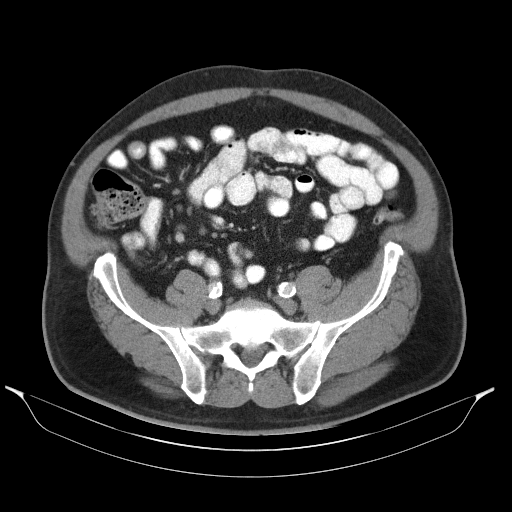
[im 53/100  soft-tissue]
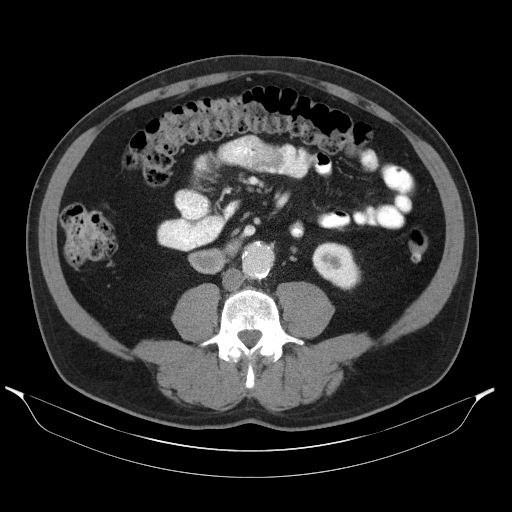
[im 60/100  soft-tissue]
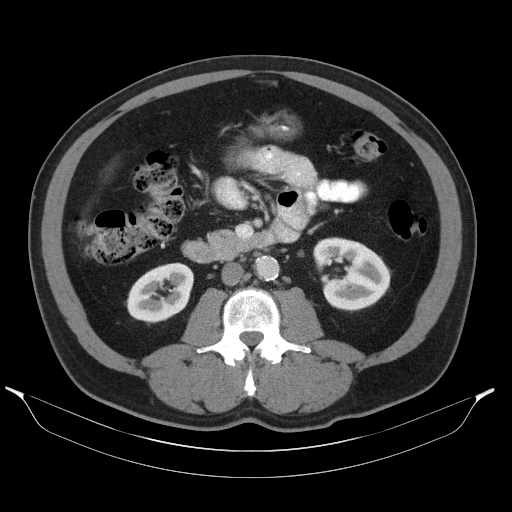
[im 67/100  soft-tissue]
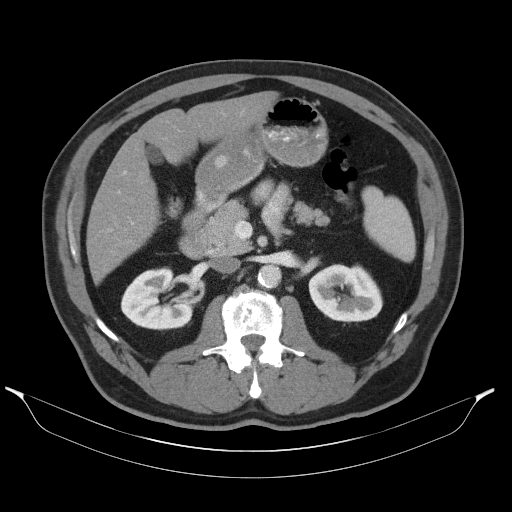
[im 67/100  bone]
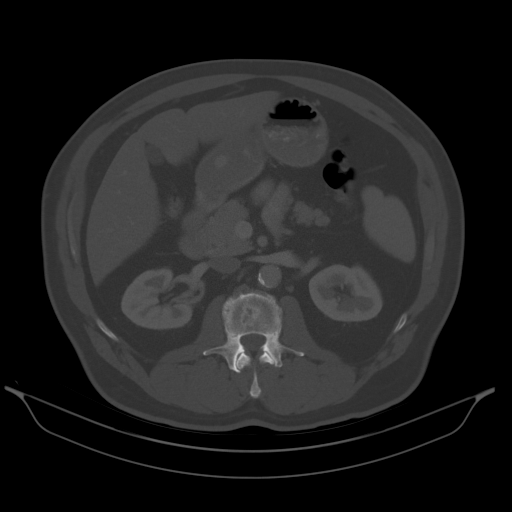
[im 73/100  soft-tissue]
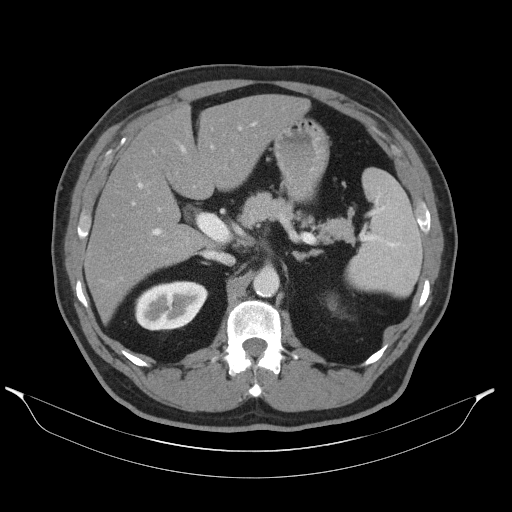
[im 73/100  lung]
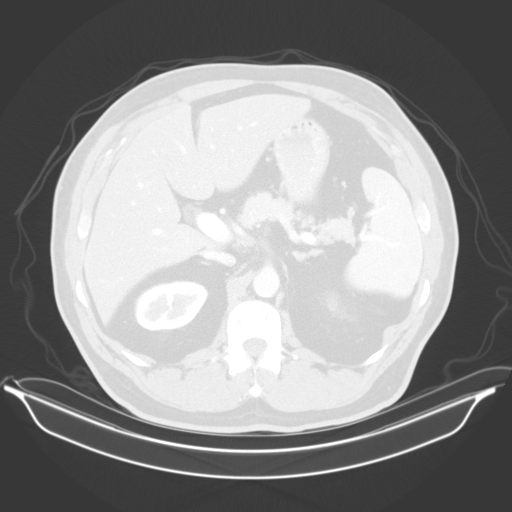
[im 80/100  soft-tissue]
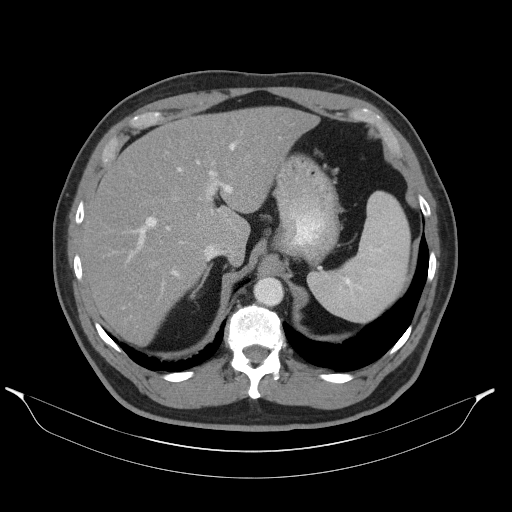
[im 80/100  lung]
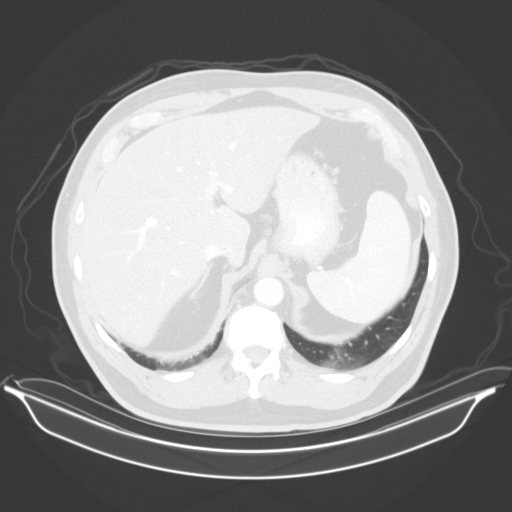
[im 86/100  soft-tissue]
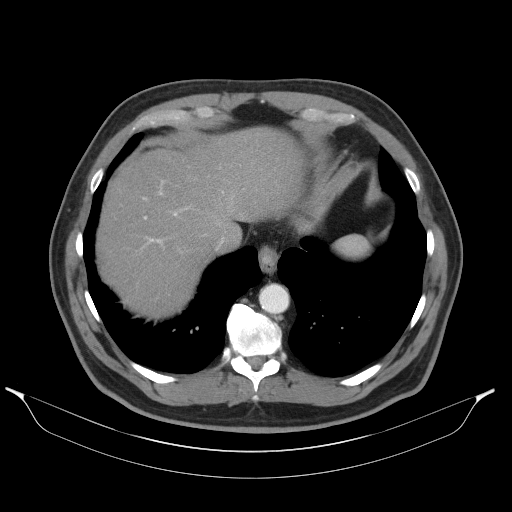
[im 86/100  lung]
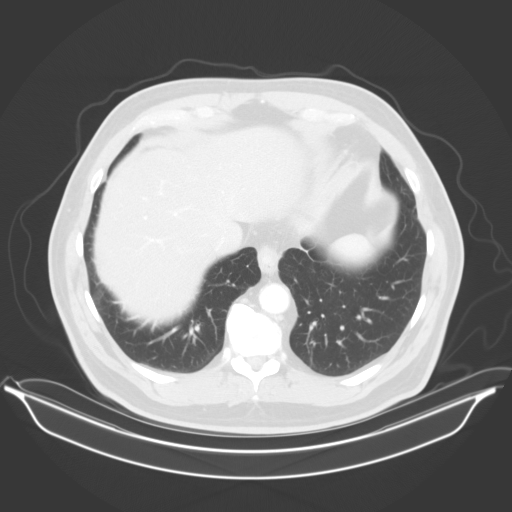
[im 93/100  soft-tissue]
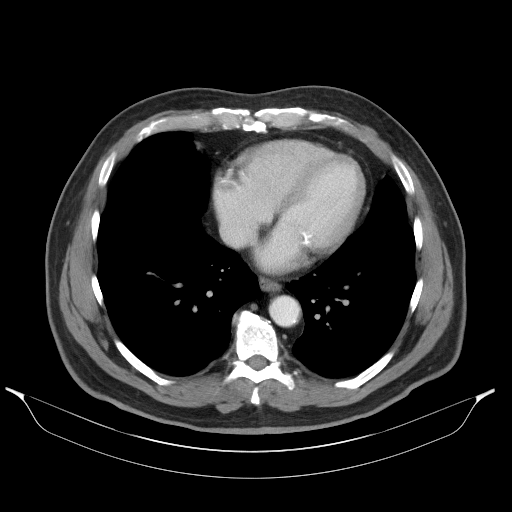
[im 93/100  lung]
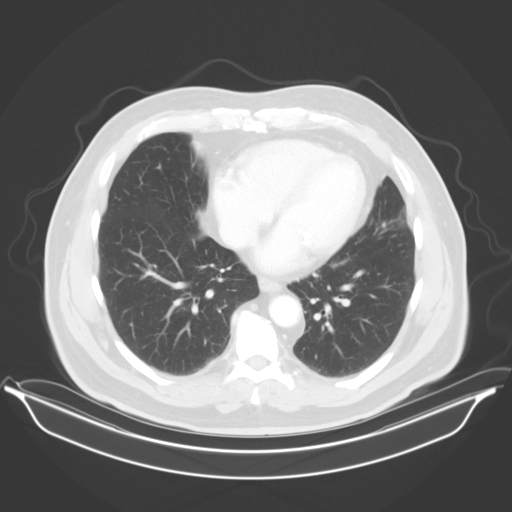

[13 of 32 positions shown; findings below may reference images not displayed]

FINDINGS: Lower Chest: No acute findings.

Hepatobiliary: No hepatic masses identified. Probable tiny sub-cm
left hepatic lobe cyst. Mild-to-moderate diffuse hepatic steatosis
remains stable. Gallbladder is unremarkable. No evidence of biliary
ductal dilatation.

Pancreas:  No mass or inflammatory changes.

Spleen: Within normal limits in size and appearance.

Adrenals/Urinary Tract: No masses identified. Tiny left renal cyst
noted. No evidence of hydronephrosis. Unremarkable unopacified
urinary bladder.

Stomach/Bowel: No evidence of obstruction, inflammatory process or
abnormal fluid collections. Diverticulosis is seen mainly involving
the descending and sigmoid colon, however there is no evidence of
diverticulitis.

Vascular/Lymphatic: No pathologically enlarged lymph nodes. 3.2 cm
infrarenal abdominal aortic aneurysm is mildly enlarged from 2.9 cm
previous exam.

Reproductive: Mildly enlarged prostate shows slight increase in
size.

Other:  None.

Musculoskeletal:  No suspicious bone lesions identified.
IMPRESSION: 1. Colonic diverticulosis. No radiographic evidence of
diverticulitis or other acute findings.
2. Slight increase in size of 3.2 cm infrarenal abdominal aortic
aneurysm. Recommend followup by ultrasound in 3 years. This
recommendation follows ACR consensus guidelines: White Paper of the
ACR Incidental Findings Committee II on Vascular Findings. [HOSPITAL] 0230; [DATE].
3. Stable hepatic steatosis.
4. Mildly enlarged prostate.

## 2019-12-14 IMAGING — CR DG FOREARM 2V*R*
2 series · 2 of 2 positions shown · non-contrast
Comparison: None.

CLINICAL DATA: 68-year-old male with fall and trauma to the right
upper extremity.

EXAM:
RIGHT FOREARM - 2 VIEW; RIGHT ELBOW - COMPLETE 3+ VIEW

[forearm ap]
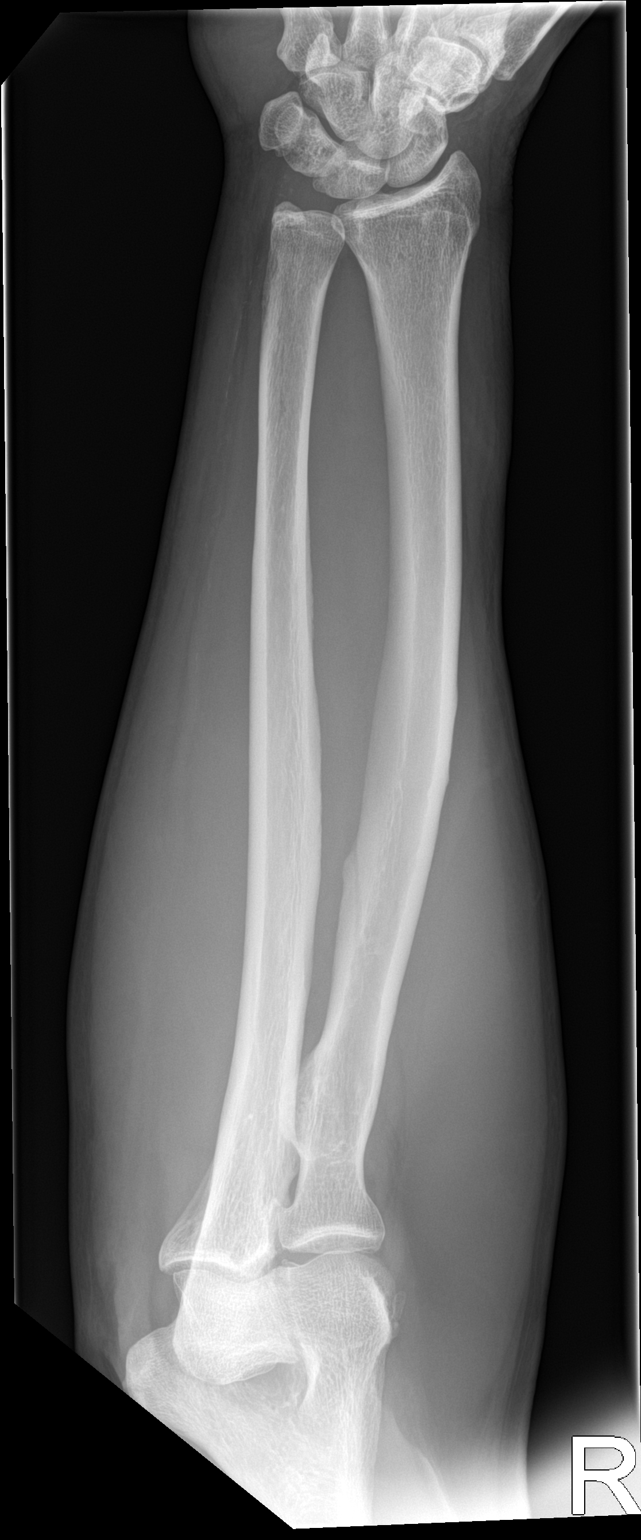

[forearm lat]
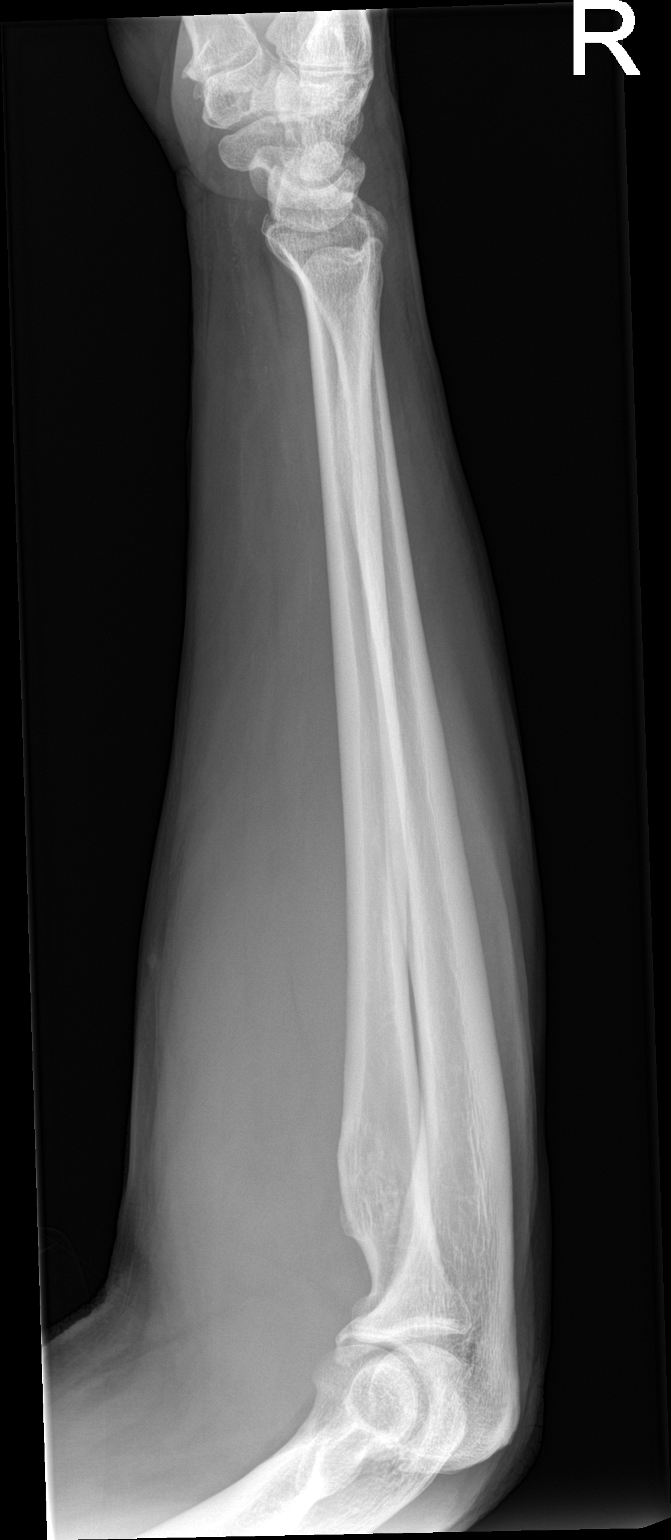

[2 of 2 positions shown; findings below may reference images not displayed]

FINDINGS: There is no acute fracture or dislocation. The bones are well
mineralized. No significant arthritic changes. No joint effusion.
The soft tissues are unremarkable.
IMPRESSION: Negative.

## 2019-12-14 IMAGING — CR DG ELBOW COMPLETE 3+V*R*
4 series · 4 of 4 positions shown · non-contrast
Comparison: None.

CLINICAL DATA: 68-year-old male with fall and trauma to the right
upper extremity.

EXAM:
RIGHT FOREARM - 2 VIEW; RIGHT ELBOW - COMPLETE 3+ VIEW

[elbow ap]
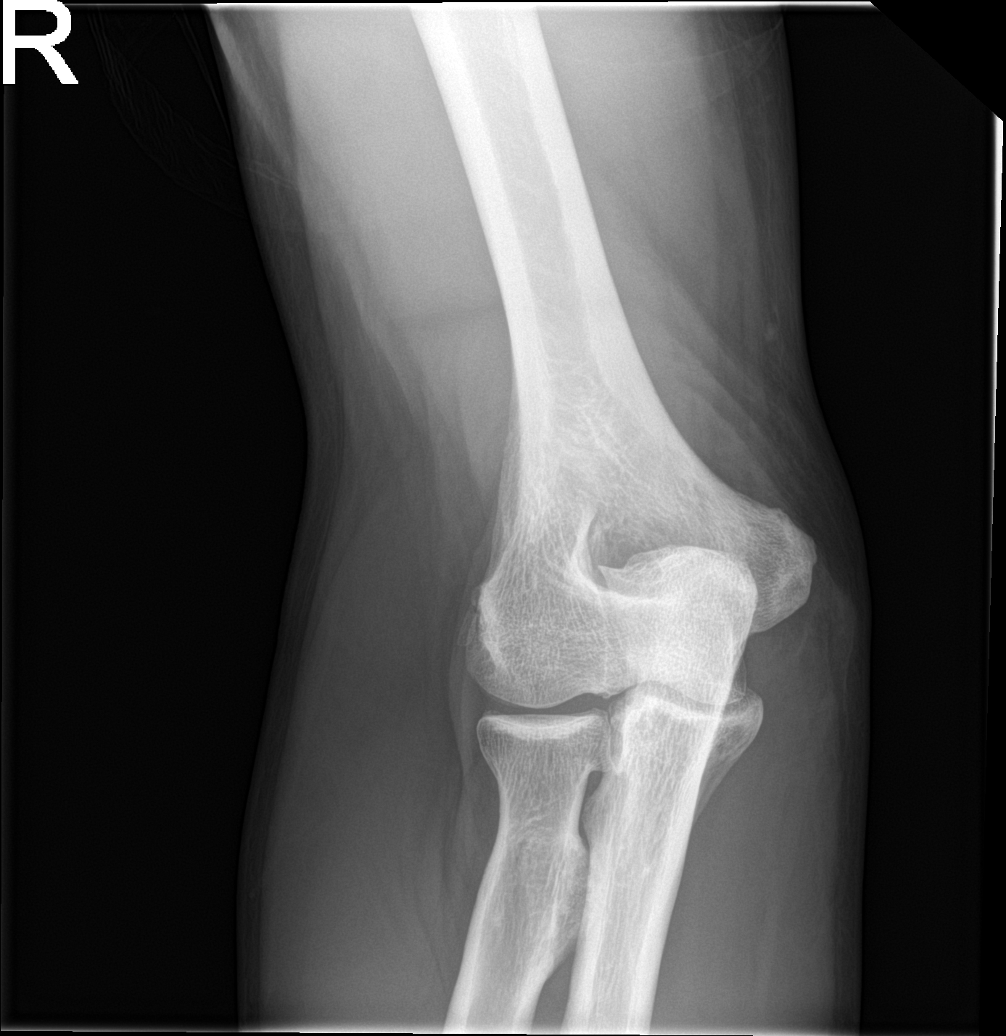

[elbow obl (1 of 2)]
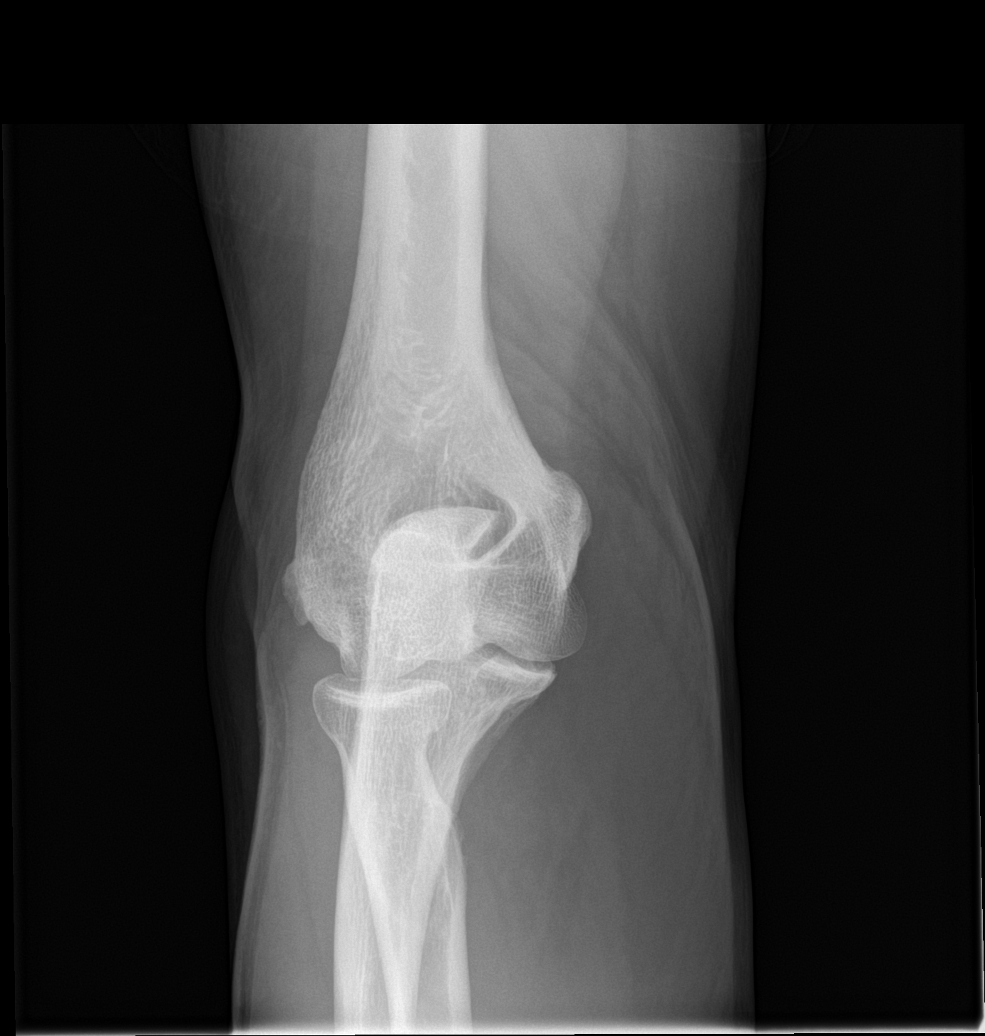

[elbow obl (2 of 2)]
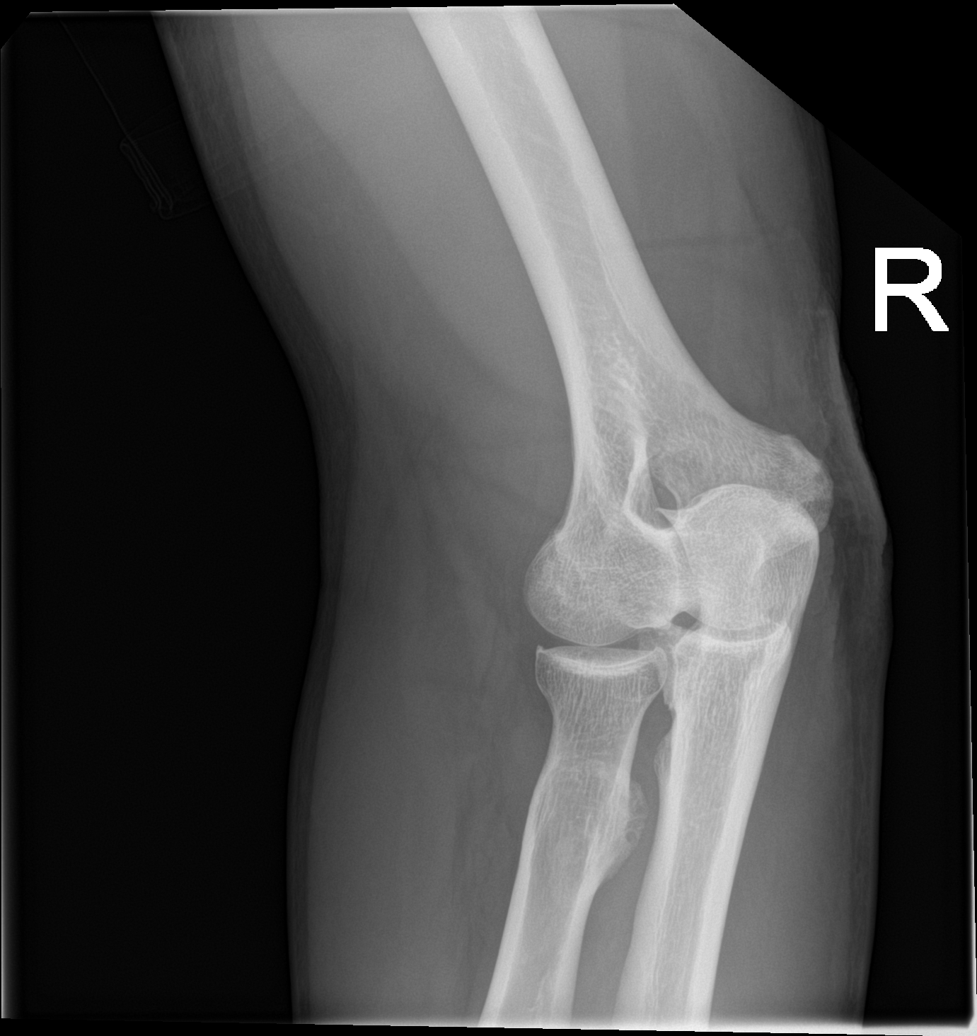

[elbow lat]
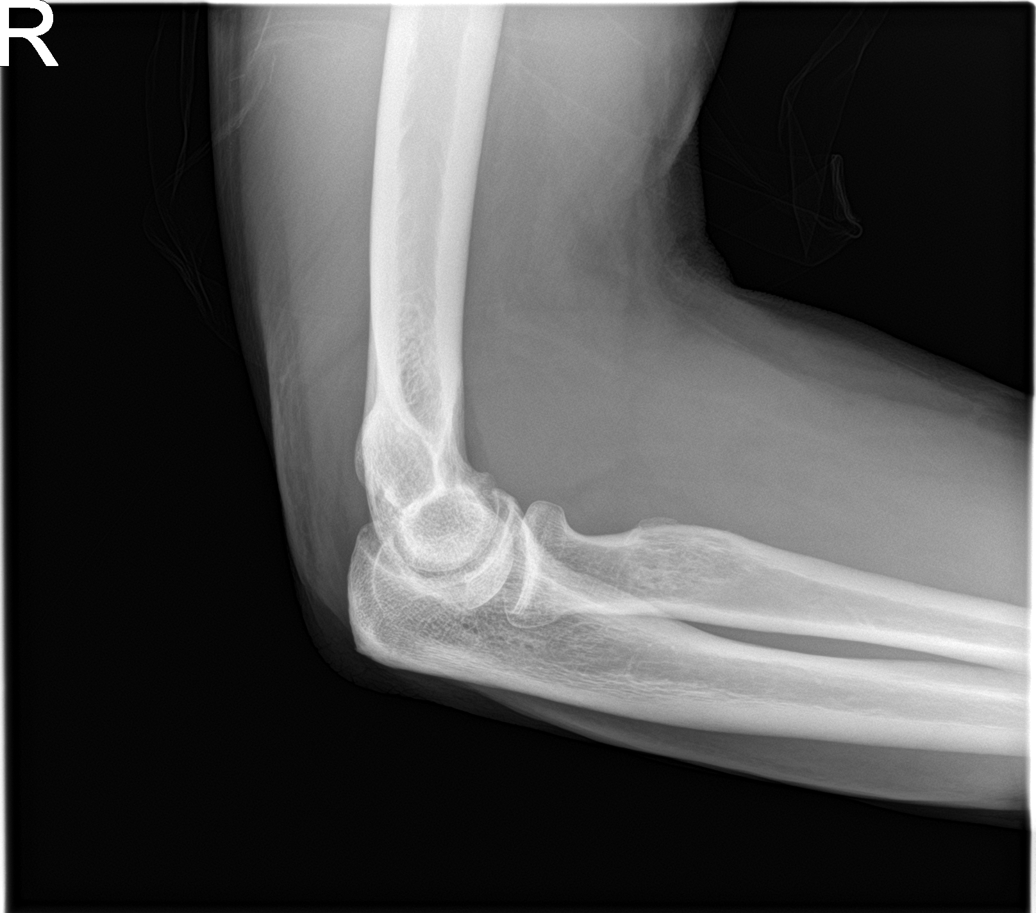

[4 of 4 positions shown; findings below may reference images not displayed]

FINDINGS: There is no acute fracture or dislocation. The bones are well
mineralized. No significant arthritic changes. No joint effusion.
The soft tissues are unremarkable.
IMPRESSION: Negative.

## 2020-03-26 DIAGNOSIS — H6123 Impacted cerumen, bilateral: Secondary | ICD-10-CM | POA: Insufficient documentation

## 2020-07-21 DIAGNOSIS — N401 Enlarged prostate with lower urinary tract symptoms: Secondary | ICD-10-CM | POA: Insufficient documentation

## 2020-08-06 ENCOUNTER — Emergency Department (HOSPITAL_COMMUNITY): Payer: Medicare Other

## 2020-08-06 ENCOUNTER — Encounter (HOSPITAL_COMMUNITY): Payer: Self-pay

## 2020-08-06 ENCOUNTER — Emergency Department (HOSPITAL_COMMUNITY)
Admission: EM | Admit: 2020-08-06 | Discharge: 2020-08-06 | Disposition: A | Discharge status: Home / Self Care | Payer: Medicare Other | Attending: Emergency Medicine | Admitting: Emergency Medicine

## 2020-08-06 ENCOUNTER — Other Ambulatory Visit: Payer: Self-pay

## 2020-08-06 DIAGNOSIS — Z7982 Long term (current) use of aspirin: Secondary | ICD-10-CM | POA: Insufficient documentation

## 2020-08-06 DIAGNOSIS — E119 Type 2 diabetes mellitus without complications: Secondary | ICD-10-CM | POA: Insufficient documentation

## 2020-08-06 DIAGNOSIS — Z7984 Long term (current) use of oral hypoglycemic drugs: Secondary | ICD-10-CM | POA: Diagnosis not present

## 2020-08-06 DIAGNOSIS — Z955 Presence of coronary angioplasty implant and graft: Secondary | ICD-10-CM | POA: Diagnosis not present

## 2020-08-06 DIAGNOSIS — R110 Nausea: Secondary | ICD-10-CM | POA: Insufficient documentation

## 2020-08-06 DIAGNOSIS — I2510 Atherosclerotic heart disease of native coronary artery without angina pectoris: Secondary | ICD-10-CM | POA: Insufficient documentation

## 2020-08-06 DIAGNOSIS — Z87891 Personal history of nicotine dependence: Secondary | ICD-10-CM | POA: Diagnosis not present

## 2020-08-06 DIAGNOSIS — R079 Chest pain, unspecified: Secondary | ICD-10-CM | POA: Diagnosis present

## 2020-08-06 LAB — CBC
HCT: 49.5 % (ref 39.0–52.0)
Hemoglobin: 17.3 g/dL — ABNORMAL HIGH (ref 13.0–17.0)
MCH: 30.9 pg (ref 26.0–34.0)
MCHC: 34.9 g/dL (ref 30.0–36.0)
MCV: 88.4 fL (ref 80.0–100.0)
Platelets: 171 10*3/uL (ref 150–400)
RBC: 5.6 MIL/uL (ref 4.22–5.81)
RDW: 12.6 % (ref 11.5–15.5)
WBC: 8.3 10*3/uL (ref 4.0–10.5)
nRBC: 0 % (ref 0.0–0.2)

## 2020-08-06 LAB — TROPONIN I (HIGH SENSITIVITY)
Troponin I (High Sensitivity): 4 ng/L (ref <18)
Troponin I (High Sensitivity): 3 ng/L (ref <18)

## 2020-08-06 LAB — BASIC METABOLIC PANEL
Anion gap: 10 (ref 5–15)
BUN: 14 mg/dL (ref 8–23)
CO2: 24 mmol/L (ref 22–32)
Calcium: 9.9 mg/dL (ref 8.9–10.3)
Chloride: 103 mmol/L (ref 98–111)
Creatinine, Ser: 1.24 mg/dL (ref 0.61–1.24)
GFR, Estimated: >60 mL/min (ref >60)
Glucose, Bld: 159 mg/dL — ABNORMAL HIGH (ref 70–99)
Potassium: 4.1 mmol/L (ref 3.5–5.1)
Sodium: 137 mmol/L (ref 135–145)

## 2020-08-06 NOTE — ED Notes (Signed)
Pt states pain is easing off. Not diaphoretic. [id number]

## 2020-08-06 NOTE — ED Provider Notes (Signed)
[facility] EMERGENCY DEPARTMENT Provider Note   CSN: [id number] Arrival date & time: 08/06/20  0033     History Chief Complaint  Patient presents with  . Chest Pain    Jackson Porter is a 70 y.o. male.  Patient presents to the emergency department for evaluation of chest pain.  Patient awakened from sleep with a sharp, somewhat severe pain across his upper chest.  He reports that he went to bed feeling fine.  He took an antacid and some Rolaids but it did not help.  Patient denies any shortness of breath.  He did have some nausea associated with the pain.  At time of my evaluation patient reports that the pain is almost completely gone.  Patient reports cardiac stents placed when he was 70 years old.  He has periodically had stress tests over the years since and has always been told that everything looked okay.  When he had his stents placed he never had chest pain, was experiencing severe dyspnea.        Past Medical History:  Diagnosis Date  . Coronary artery disease   . Diabetes mellitus without complication [facility])     Patient Active Problem List   Diagnosis Date Noted  . Acute bronchitis 02/27/2019    Past Surgical History:  Procedure Laterality Date  . APPENDECTOMY    . CORONARY STENT PLACEMENT    . HERNIA REPAIR    . TONSILLECTOMY         History reviewed. No pertinent family history.  Social History   Tobacco Use  . Smoking status: Former Games developer  . Smokeless tobacco: Never Used  Substance Use Topics  . Alcohol use: No  . Drug use: No    Home Medications Prior to Admission medications   Medication Sig Start Date End Date Taking? Authorizing Provider  aspirin 325 MG tablet Take 325 mg by mouth daily.    [provider]  ibuprofen (ADVIL) 200 MG tablet Take 200 mg by mouth every 6 (six) hours as needed.    [provider]  JARDIANCE 10 MG TABS tablet  01/24/19   [provider]  metFORMIN  (GLUCOPHAGE) 1000 MG tablet  01/24/19   [provider]  metoprolol succinate (TOPROL-XL) 50 MG 24 hr tablet  01/21/19   [provider]  ramipril (ALTACE) 2.5 MG capsule Take 2.5 mg by mouth daily.    [provider]  rosuvastatin (CRESTOR) 20 MG tablet  11/18/18   [provider]  Specialty Vitamins Products (PROSTATE PO) Take by mouth.    [provider]  VITAMIN D PO Take by mouth.    [provider]    Allergies    Ibuprofen  Review of Systems   Review of Systems  Cardiovascular: Positive for chest pain.  Gastrointestinal: Positive for nausea.  All other systems reviewed and are negative.   Physical Exam Updated Vital Signs BP 134/77   Pulse 65   Temp 99.3 F (37.4 C) (Oral)   Resp 18   Ht 5\' 8"  (1.727 m)   Wt 100 kg   SpO2 95%   BMI 33.52 kg/m   Physical Exam Vitals and nursing note reviewed.  Constitutional:      General: He is not in acute distress.    Appearance: Normal appearance. He is well-developed.  HENT:     Head: Normocephalic and atraumatic.     Right Ear: Hearing normal.     Left Ear: Hearing normal.  Nose: Nose normal.  Eyes:     Conjunctiva/sclera: Conjunctivae normal.     Pupils: Pupils are equal, round, and reactive to light.  Cardiovascular:     Rate and Rhythm: Regular rhythm.     Heart sounds: S1 normal and S2 normal. No murmur heard. No friction rub. No gallop.   Pulmonary:     Effort: Pulmonary effort is normal. No respiratory distress.     Breath sounds: Normal breath sounds.  Chest:     Chest wall: No tenderness.  Abdominal:     General: Bowel sounds are normal.     Palpations: Abdomen is soft.     Tenderness: There is no abdominal tenderness. There is no guarding or rebound. Negative signs include Murphy's sign and McBurney's sign.     Hernia: No hernia is present.  Musculoskeletal:        General: Normal range of motion.     Cervical back: Normal range of motion and neck  supple.  Skin:    General: Skin is warm and dry.     Findings: No rash.  Neurological:     Mental Status: He is alert and oriented to person, place, and time.     GCS: GCS eye subscore is 4. GCS verbal subscore is 5. GCS motor subscore is 6.     Cranial Nerves: No cranial nerve deficit.     Sensory: No sensory deficit.     Coordination: Coordination normal.  Psychiatric:        Speech: Speech normal.        Behavior: Behavior normal.        Thought Content: Thought content normal.     ED Results / Procedures / Treatments   Labs (all labs ordered are listed, but only abnormal results are displayed) Labs Reviewed  BASIC METABOLIC PANEL - Abnormal; Notable for the following components:      Result Value   Glucose, Bld 159 (*)    All other components within normal limits  CBC - Abnormal; Notable for the following components:   Hemoglobin 17.3 (*)    All other components within normal limits  TROPONIN I (HIGH SENSITIVITY)  TROPONIN I (HIGH SENSITIVITY)    EKG EKG Interpretation  Date/Time:  Friday August 06 2020 00:42:04 EDT Ventricular Rate:  73 PR Interval:  170 QRS Duration: 122 QT Interval:  408 QTC Calculation: 449 R Axis:   -79 Text Interpretation: Normal sinus rhythm Left axis deviation Right bundle branch block Inferior infarct , age undetermined Anterolateral infarct , age undetermined Abnormal ECG similar to 2013 tracing Confirmed by Alona Bene [phone]) on 08/06/2020 12:46:23 AM   Radiology DG Chest 2 View  Result Date: 08/06/2020 CLINICAL DATA:  Chest pain across chest, nonradiating EXAM: CHEST - 2 VIEW COMPARISON:  Radiograph 01/16/2012, CT 03/15/2018 FINDINGS: Some streaky and bandlike opacities in the bases favor atelectatic change. Accessory azygos fissure is noted. Stable cardiomediastinal contours with a calcified aorta. Suspect at least some hyperinflation flattening of the diaphragms and increased AP diameter of the chest. No consolidative process or  convincing edema. No pneumothorax or layering effusion. Multilevel degenerative changes are present in the imaged portions of the spine. No other acute osseous or soft tissue abnormality. IMPRESSION: 1. Streaky and bandlike opacities in the bases favor atelectatic change. No other acute cardiopulmonary abnormality. 2. Chronic hyperinflation. 3.  Aortic Atherosclerosis (ICD10-I70.0). 4. Accessory azygos fissure. Electronically Signed   By: Kreg Shropshire M.D.   On: 08/06/2020 01:19    Procedures  Procedures   Medications Ordered in ED Medications - No data to display  ED Course  I have reviewed the triage vital signs and the nursing notes.  Pertinent labs & imaging results that were available during my care of the patient were reviewed by me and considered in my medical decision making (see chart for details).    MDM Rules/Calculators/A&P                          Patient presents to the emergency department for evaluation of chest pain.  Patient with onset of a sharp pain across his upper chest while sleeping.  Patient reports resolution of the pain at this time, has not had any medications or interventions.  Cardiac evaluation is unremarkable, negative troponins.  As he is currently pain-free, I do not see a reason to hospitalize him.  He can call his cardiologist, Dr. Sharyn Lull this morning for follow-up.  He was counseled to return to the ER for recurrence of chest pain.  Final Clinical Impression(s) / ED Diagnoses Final diagnoses:  Chest pain, unspecified type    Rx / DC Orders ED Discharge Orders    None       Torian Quintero, Canary Brim, MD 08/06/20 [phone]

## 2020-08-06 NOTE — ED Notes (Signed)
Patient transported to X-ray 

## 2020-08-06 NOTE — ED Notes (Signed)
Patient verbalized understanding of discharge instructions. Opportunity for questions and answers.  

## 2020-08-06 NOTE — ED Triage Notes (Signed)
Chest pain - all across his chest, non radiating, woke pt up from sleep. Pain 5/10. Hx of cardiac stents.

## 2021-11-09 DIAGNOSIS — E119 Type 2 diabetes mellitus without complications: Secondary | ICD-10-CM | POA: Insufficient documentation

## 2021-12-18 ENCOUNTER — Emergency Department (HOSPITAL_COMMUNITY): Payer: Medicare Other

## 2021-12-18 ENCOUNTER — Emergency Department (HOSPITAL_COMMUNITY)
Admission: EM | Admit: 2021-12-18 | Discharge: 2021-12-18 | Disposition: A | Discharge status: Home / Self Care | Payer: Medicare Other | Attending: Emergency Medicine | Admitting: Emergency Medicine

## 2021-12-18 ENCOUNTER — Other Ambulatory Visit: Payer: Self-pay

## 2021-12-18 ENCOUNTER — Encounter (HOSPITAL_COMMUNITY): Payer: Self-pay

## 2021-12-18 ENCOUNTER — Ambulatory Visit (HOSPITAL_COMMUNITY)
Admission: EM | Admit: 2021-12-18 | Discharge: 2021-12-18 | Disposition: A | Discharge status: Other Acute Inpatient Hospital | Payer: Medicare Other | Attending: Internal Medicine | Admitting: Internal Medicine

## 2021-12-18 DIAGNOSIS — K8080 Other cholelithiasis without obstruction: Secondary | ICD-10-CM

## 2021-12-18 DIAGNOSIS — R42 Dizziness and giddiness: Secondary | ICD-10-CM

## 2021-12-18 DIAGNOSIS — R110 Nausea: Secondary | ICD-10-CM | POA: Diagnosis not present

## 2021-12-18 DIAGNOSIS — Z7982 Long term (current) use of aspirin: Secondary | ICD-10-CM | POA: Diagnosis not present

## 2021-12-18 DIAGNOSIS — R0789 Other chest pain: Secondary | ICD-10-CM | POA: Diagnosis not present

## 2021-12-18 DIAGNOSIS — K8021 Calculus of gallbladder without cholecystitis with obstruction: Secondary | ICD-10-CM | POA: Insufficient documentation

## 2021-12-18 DIAGNOSIS — R079 Chest pain, unspecified: Secondary | ICD-10-CM

## 2021-12-18 DIAGNOSIS — I739 Peripheral vascular disease, unspecified: Secondary | ICD-10-CM | POA: Diagnosis not present

## 2021-12-18 LAB — HEPATIC FUNCTION PANEL
ALT: 44 U/L (ref 0–44)
AST: 39 U/L (ref 15–41)
Albumin: 4 g/dL (ref 3.5–5.0)
Alkaline Phosphatase: 58 U/L (ref 38–126)
Bilirubin, Direct: 0.2 mg/dL (ref 0.0–0.2)
Indirect Bilirubin: 0.8 mg/dL (ref 0.3–0.9)
Total Bilirubin: 1 mg/dL (ref 0.3–1.2)
Total Protein: 6.8 g/dL (ref 6.5–8.1)

## 2021-12-18 LAB — CBC WITH DIFFERENTIAL/PLATELET
Abs Immature Granulocytes: 0.02 10*3/uL (ref 0.00–0.07)
Basophils Absolute: 0 10*3/uL (ref 0.0–0.1)
Basophils Relative: 0 %
Eosinophils Absolute: 0 10*3/uL (ref 0.0–0.5)
Eosinophils Relative: 0 %
HCT: 47.8 % (ref 39.0–52.0)
Hemoglobin: 16.7 g/dL (ref 13.0–17.0)
Immature Granulocytes: 0 %
Lymphocytes Relative: 18 %
Lymphs Abs: 1.4 10*3/uL (ref 0.7–4.0)
MCH: 31.2 pg (ref 26.0–34.0)
MCHC: 34.9 g/dL (ref 30.0–36.0)
MCV: 89.2 fL (ref 80.0–100.0)
Monocytes Absolute: 0.5 10*3/uL (ref 0.1–1.0)
Monocytes Relative: 7 %
Neutro Abs: 5.8 10*3/uL (ref 1.7–7.7)
Neutrophils Relative %: 75 %
Platelets: 139 10*3/uL — ABNORMAL LOW (ref 150–400)
RBC: 5.36 MIL/uL (ref 4.22–5.81)
RDW: 13 % (ref 11.5–15.5)
WBC: 7.8 10*3/uL (ref 4.0–10.5)
nRBC: 0 % (ref 0.0–0.2)

## 2021-12-18 LAB — LIPASE, BLOOD: Lipase: 28 U/L (ref 11–51)

## 2021-12-18 LAB — TROPONIN I (HIGH SENSITIVITY)
Troponin I (High Sensitivity): 4 ng/L (ref <18)
Troponin I (High Sensitivity): 5 ng/L (ref <18)

## 2021-12-18 LAB — BASIC METABOLIC PANEL
Anion gap: 8 (ref 5–15)
BUN: 11 mg/dL (ref 8–23)
CO2: 20 mmol/L — ABNORMAL LOW (ref 22–32)
Calcium: 9.2 mg/dL (ref 8.9–10.3)
Chloride: 110 mmol/L (ref 98–111)
Creatinine, Ser: 1.07 mg/dL (ref 0.61–1.24)
GFR, Estimated: >60 mL/min (ref >60)
Glucose, Bld: 270 mg/dL — ABNORMAL HIGH (ref 70–99)
Potassium: 4.1 mmol/L (ref 3.5–5.1)
Sodium: 138 mmol/L (ref 135–145)

## 2021-12-18 MED ORDER — MORPHINE SULFATE (PF) 4 MG/ML IV SOLN
4.0000 mg | Freq: Once | INTRAVENOUS | Status: AC
  Administered 2021-12-18: 4 mg via INTRAVENOUS
  Filled 2021-12-18: qty 1

## 2021-12-18 MED ORDER — ASPIRIN 81 MG PO CHEW
CHEWABLE_TABLET | ORAL | Status: AC
  Filled 2021-12-18: qty 3

## 2021-12-18 MED ORDER — OXYCODONE-ACETAMINOPHEN 5-325 MG PO TABS: 1.0000 | ORAL_TABLET | ORAL | 0 refills | Status: DC | PRN

## 2021-12-18 MED ORDER — PANTOPRAZOLE SODIUM 40 MG IV SOLR
40.0000 mg | Freq: Once | INTRAVENOUS | Status: AC
  Administered 2021-12-18: 40 mg via INTRAVENOUS
  Filled 2021-12-18: qty 10

## 2021-12-18 MED ORDER — IOHEXOL 350 MG/ML SOLN
100.0000 mL | Freq: Once | INTRAVENOUS | Status: AC | PRN
  Administered 2021-12-18: 100 mL via INTRAVENOUS

## 2021-12-18 MED ORDER — NITROGLYCERIN 0.4 MG SL SUBL
SUBLINGUAL_TABLET | SUBLINGUAL | Status: AC
  Filled 2021-12-18: qty 1

## 2021-12-18 MED ORDER — SUCRALFATE 1 G PO TABS: 1.0000 g | ORAL_TABLET | Freq: Three times a day (TID) | ORAL | 0 refills | Status: DC

## 2021-12-18 MED ORDER — ONDANSETRON HCL 4 MG/2ML IJ SOLN
INTRAMUSCULAR | Status: AC
  Filled 2021-12-18: qty 2

## 2021-12-18 MED ORDER — ASPIRIN 81 MG PO CHEW
244.0000 mg | CHEWABLE_TABLET | Freq: Once | ORAL | Status: AC
  Administered 2021-12-18: 244 mg via ORAL

## 2021-12-18 MED ORDER — NITROGLYCERIN 0.4 MG SL SUBL
0.4000 mg | SUBLINGUAL_TABLET | SUBLINGUAL | Status: DC | PRN
  Administered 2021-12-18 (×2): 0.4 mg via SUBLINGUAL

## 2021-12-18 MED ORDER — ONDANSETRON HCL 4 MG/2ML IJ SOLN
4.0000 mg | Freq: Once | INTRAMUSCULAR | Status: AC
  Administered 2021-12-18: 4 mg via INTRAVENOUS
  Filled 2021-12-18: qty 2

## 2021-12-18 MED ORDER — ONDANSETRON HCL 4 MG/2ML IJ SOLN
4.0000 mg | Freq: Once | INTRAMUSCULAR | Status: AC
  Administered 2021-12-18: 4 mg via INTRAVENOUS

## 2021-12-18 MED ORDER — ALUM & MAG HYDROXIDE-SIMETH 200-200-20 MG/5ML PO SUSP
30.0000 mL | Freq: Once | ORAL | Status: AC
  Administered 2021-12-18: 30 mL via ORAL
  Filled 2021-12-18: qty 30

## 2021-12-18 NOTE — ED Notes (Signed)
Report called to [employer], [facility] arrived to transport patient.

## 2021-12-18 NOTE — ED Triage Notes (Signed)
Pt presents with chest pain that began 2 hours ago. Left chest radiating to left shoulder. Rates pain 10/10.  Took Pepsi x 2 with no relief. Unable to find his Nitro.  Hx of CAD, stent placement, heart cath less than 5 years ago.

## 2021-12-18 NOTE — ED Notes (Signed)
Pt states aspirin has his stomach feeling like it is burning. Pt requested some water notified EDP

## 2021-12-18 NOTE — ED Notes (Signed)
Patient is being discharged from the Urgent Care and sent to the Emergency Department via [facility] . Per Marcelino Duster NP, patient is in need of higher level of care due to chest pain and abnormal EKG changes. Patient is aware and verbalizes understanding of plan of care.  Vitals:   12/18/21 1132  BP: (!) 187/78  Pulse: 63  SpO2: 98%

## 2021-12-18 NOTE — ED Notes (Signed)
Provider Marcelino Duster, NP called code STEMI.

## 2021-12-18 NOTE — ED Notes (Signed)
Provided pt water.  °

## 2021-12-18 NOTE — ED Notes (Signed)
[facility] called for transport. 

## 2021-12-18 NOTE — ED Notes (Signed)
Emesis vent post administration of Maalox.

## 2021-12-18 NOTE — Discharge Instructions (Signed)
It is not clear exactly what was causing her pain earlier today.  You may have esophagitis or gastritis.  We are adding a medicine called sucralfate, to take with your pantoprazole to control acid symptoms.  You can continue using Tums or Rolaids as well.  Call the GI doctor for further evaluation and treatment of the symptoms testing showed gallstones.  Call the surgeon for follow-up appointment to be seen about that.  Talk to Dr. Sharyn Lull about the peripheral vascular disease that was seen on the CT images today.  Also ask him about follow-up care necessary for the chest pain.  Return here, if needed.

## 2021-12-18 NOTE — ED Provider Notes (Signed)
[facility] EMERGENCY DEPARTMENT Provider Note   CSN: [id number] Arrival date & time: 12/18/21  1212     History  Chief Complaint  Patient presents with   Chest Pain    Jackson Porter is a 71 y.o. male.  HPI Patient is here for evaluation of chest pain, which started 3 hours ago, was initially 12/10, now 5/10.  He went to an urgent care where he was assessed, and treated with 324 mg of aspirin and 2 sublingual nitroglycerin.  His pain went from 12 over 10-5/10.  They did an EKG and felt he was having a STEMI so arrange for urgent transfer here.  I saw the patient on his arrival with cardiologist and we canceled the code STEMI.  Twelve-lead EKG repeated at 1219 is unchanged.  No indication for ST elevation MI.  Patient denies other recent symptoms.  He does not take nitroglycerin at home.  He last saw his cardiologist about 1 month ago for routine management.    Home Medications Prior to Admission medications   Medication Sig Start Date End Date Taking? Authorizing Provider  oxyCODONE-acetaminophen (PERCOCET) 5-325 MG tablet Take 1 tablet by mouth every 4 (four) hours as needed for severe pain. 12/18/21  Yes Mancel Bale, MD  sucralfate (CARAFATE) 1 g tablet Take 1 tablet (1 g total) by mouth 4 (four) times daily -  with meals and at bedtime. 12/18/21  Yes Mancel Bale, MD  aspirin 325 MG tablet Take 325 mg by mouth daily.    [provider]  ibuprofen (ADVIL) 200 MG tablet Take 200 mg by mouth every 6 (six) hours as needed.    [provider]  JARDIANCE 10 MG TABS tablet  01/24/19   [provider]  metFORMIN (GLUCOPHAGE) 1000 MG tablet  01/24/19   [provider]  metoprolol succinate (TOPROL-XL) 50 MG 24 hr tablet  01/21/19   [provider]  ramipril (ALTACE) 2.5 MG capsule Take 2.5 mg by mouth daily.    [provider]  rosuvastatin (CRESTOR) 20 MG tablet  11/18/18   [provider]  Specialty  Vitamins Products (PROSTATE PO) Take by mouth.    [provider]  VITAMIN D PO Take by mouth.    [provider]      Allergies    Ibuprofen    Review of Systems   Review of Systems  Physical Exam Updated Vital Signs BP 138/67   Pulse 90   Temp (!) 97.5 F (36.4 C) (Oral)   Resp 18   Ht 5\' 8"  (1.727 m)   Wt 98.4 kg   SpO2 97%   BMI 32.99 kg/m  Physical Exam Vitals and nursing note reviewed.  Constitutional:      Appearance: He is well-developed. He is not ill-appearing.  HENT:     Head: Normocephalic and atraumatic.     Right Ear: External ear normal.     Left Ear: External ear normal.     Nose: No congestion.  Eyes:     Conjunctiva/sclera: Conjunctivae normal.     Pupils: Pupils are equal, round, and reactive to light.  Neck:     Trachea: Phonation normal.  Cardiovascular:     Rate and Rhythm: Normal rate and regular rhythm.     Heart sounds: Normal heart sounds.  Pulmonary:     Effort: Pulmonary effort is normal.     Breath sounds: Normal breath sounds.  Abdominal:     General: There is no  distension.     Palpations: Abdomen is soft.     Tenderness: There is no abdominal tenderness.  Musculoskeletal:        General: Normal range of motion.     Cervical back: Normal range of motion and neck supple.     Right lower leg: No edema.     Left lower leg: No edema.  Skin:    General: Skin is warm and dry.     Findings: No lesion or rash.  Neurological:     Mental Status: He is alert and oriented to person, place, and time.     Cranial Nerves: No cranial nerve deficit.     Sensory: No sensory deficit.     Motor: No abnormal muscle tone.     Coordination: Coordination normal.  Psychiatric:        Mood and Affect: Mood normal.        Behavior: Behavior normal.        Thought Content: Thought content normal.        Judgment: Judgment normal.     ED Results / Procedures / Treatments   Labs (all labs ordered are listed, but only abnormal  results are displayed) Labs Reviewed  BASIC METABOLIC PANEL - Abnormal; Notable for the following components:      Result Value   CO2 20 (*)    Glucose, Bld 270 (*)    All other components within normal limits  CBC WITH DIFFERENTIAL/PLATELET - Abnormal; Notable for the following components:   Platelets 139 (*)    All other components within normal limits  HEPATIC FUNCTION PANEL  LIPASE, BLOOD  TROPONIN I (HIGH SENSITIVITY)  TROPONIN I (HIGH SENSITIVITY)    EKG EKG Interpretation  Date/Time:  Sunday December 18 2021 15:26:59 EDT Ventricular Rate:  58 PR Interval:  154 QRS Duration: 147 QT Interval:  448 QTC Calculation: 440 R Axis:   -77 Text Interpretation: Sinus rhythm Right bundle branch block Inferior infarct, old Lateral leads are also involved since last tracing no significant change Confirmed by Mancel Bale [phone]) on 12/18/2021 3:48:27 PM  Radiology US Abdomen Limited RUQ (LIVER/GB)  Result Date: 12/18/2021 CLINICAL DATA:  Abdominal pain EXAM: ULTRASOUND ABDOMEN LIMITED RIGHT UPPER QUADRANT COMPARISON:  12/18/2021 FINDINGS: Gallbladder: Gallbladder is distended, with no evidence of gallbladder wall thickening or pericholecystic fluid. 7 mm gallstone is seen layering dependently within the gallbladder. The suspected impacted gallstone within the gallbladder neck or cystic duct seen on CT is not well visualized on this exam. Common bile duct: Diameter: 5 mm, suboptimal visualization due to body habitus. Liver: Increased liver echotexture consistent with hepatic steatosis. Likely areas of focal fatty sparing at the gallbladder fossa. No intrahepatic biliary duct dilation. Portal vein is patent on color Doppler imaging with normal direction of blood flow towards the liver. Other: None. IMPRESSION: 1. Cholelithiasis, with no evidence of gallbladder wall thickening or pericholecystic fluid to suggest acute cholecystitis. A 7 mm dependent gallstone is seen within the gallbladder lumen,  corresponding to gallstones seen on CT. However, the suspected impacted gallstone within the gallbladder neck or cystic duct seen on CT is not well visualized on ultrasound. If cystic duct obstruction is suspected as the cause of patient's pain, correlation with nuclear medicine hepatobiliary scan could be performed. 2. Hepatic steatosis, with focal fatty sparing near the gallbladder fossa. Electronically Signed   By: Sharlet Salina M.D.   On: 12/18/2021 17:27   CT Angio Chest/Abd/Pel for Dissection W and/or Wo Contrast  Result  Date: 12/18/2021 CLINICAL DATA:  Left chest pain radiating to left shoulder EXAM: CT ANGIOGRAPHY CHEST, ABDOMEN AND PELVIS TECHNIQUE: Non-contrast CT of the chest was initially obtained. Multidetector CT imaging through the chest, abdomen and pelvis was performed using the standard protocol during bolus administration of intravenous contrast. Multiplanar reconstructed images and MIPs were obtained and reviewed to evaluate the vascular anatomy. RADIATION DOSE REDUCTION: This exam was performed according to the departmental dose-optimization program which includes automated exposure control, adjustment of the mA and/or kV according to patient size and/or use of iterative reconstruction technique. CONTRAST:  OMNIPAQUE IOHEXOL 350 MG/ML SOLN COMPARISON:  09/20/2018 FINDINGS: CTA CHEST FINDINGS Cardiovascular: No evidence of thoracic aortic aneurysm or dissection. Atherosclerosis of the aortic arch and descending thoracic aorta. The heart is unremarkable without pericardial effusion. Extensive coronary artery atherosclerosis. While this exam is not optimized for opacification of the pulmonary vasculature, there is sufficient enhancement to exclude pulmonary emboli. No filling defects. Mediastinum/Nodes: No enlarged mediastinal, hilar, or axillary lymph nodes. Thyroid gland, trachea, and esophagus demonstrate no significant findings. Lungs/Pleura: No acute airspace disease, effusion, or  pneumothorax. Central airways are widely patent. Musculoskeletal: No acute or destructive bony lesions. Reconstructed images demonstrate no additional findings. Review of the MIP images confirms the above findings. CTA ABDOMEN AND PELVIS FINDINGS VASCULAR Aorta: There is an infrarenal abdominal aortic aneurysm again noted, now measuring 3.3 cm where previously it had measured 3.2 cm. Diffuse atherosclerosis of the abdominal aorta without evidence of dissection, vasculitis, or significant stenosis. Celiac: Patent without evidence of aneurysm, dissection, vasculitis or significant stenosis. SMA: Patent without evidence of aneurysm, dissection, vasculitis or significant stenosis. Renals: Both renal arteries are patent without evidence of aneurysm, dissection, vasculitis, fibromuscular dysplasia or significant stenosis. IMA: Patent without evidence of aneurysm, dissection, vasculitis or significant stenosis. Inflow: There is significant atherosclerosis of the bilateral common iliac arteries, with 70-99% stenosis at the origin of the right common iliac artery and within the mid left common iliac artery. No aneurysm, dissection, or vasculitis. Veins: No obvious venous abnormality within the limitations of this arterial phase study. Review of the MIP images confirms the above findings. NON-VASCULAR Hepatobiliary: Calcified gallstones are again identified. There is a 7 mm gallstone in the region of the gallbladder neck/cystic duct, reference image 138/6. No gallbladder wall thickening or pericholecystic fluid to suggest acute cholecystitis at this time. Liver is unremarkable. No biliary duct dilation. Pancreas: Unremarkable. No pancreatic ductal dilatation or surrounding inflammatory changes. Spleen: Normal in size without focal abnormality. Adrenals/Urinary Tract: Adrenal glands are unremarkable. Kidneys are normal, without renal calculi, focal lesion, or hydronephrosis. Bladder is unremarkable. Stomach/Bowel: No bowel  obstruction or ileus. Diverticulosis of the descending and sigmoid colon without diverticulitis. No bowel wall thickening or inflammatory change. Lymphatic: No pathologic adenopathy within the abdomen or pelvis. Reproductive: Mild enlargement of the prostate, with brachytherapy seeds identified. Other: No free fluid or free intraperitoneal gas. Stable small fat containing left inguinal hernia. Musculoskeletal: No acute or destructive bony lesions. Reconstructed images demonstrate no additional findings. Review of the MIP images confirms the above findings. IMPRESSION: Vascular: 1. 3.3 cm infrarenal abdominal aortic aneurysm. Recommend follow-up ultrasound every 3 years. This recommendation follows ACR consensus guidelines: White Paper of the ACR Incidental Findings Committee II on Vascular Findings. J Am Coll Radiol 2013; 10:789-794. 2. No evidence of thoracoabdominal aortic dissection. 3. 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 4. No evidence of pulmonary embolus. 5.  Aortic Atherosclerosis (ICD10-I70.0). Nonvascular: 1. Cholelithiasis,  with possible impacted gallstone in the gallbladder neck or cystic duct as above. While the gallbladder is distended, there is no gallbladder wall thickening or pericholecystic fluid to suggest acute cholecystitis at this time. 2. Colonic diverticulosis without diverticulitis. 3. Enlarged prostate. Electronically Signed   By: Sharlet SalinaMichael  Brown M.D.   On: 12/18/2021 16:18   DG Chest Port 1 View  Result Date: 12/18/2021 CLINICAL DATA:  Chest pain beginning this morning. EXAM: PORTABLE CHEST 1 VIEW COMPARISON:  08/06/2020. FINDINGS: Cardiac silhouette is normal in size. No mediastinal or hilar masses. Clear lungs.  No convincing pleural effusion.  No pneumothorax. Skeletal structures are grossly intact. IMPRESSION: No active disease. Electronically Signed   By: Amie Portlandavid  Ormond M.D.   On: 12/18/2021 13:21    Procedures Procedures     Medications Ordered in ED Medications  morphine (PF) 4 MG/ML injection 4 mg (4 mg Intravenous Given 12/18/21 1341)  pantoprazole (PROTONIX) injection 40 mg (40 mg Intravenous Given 12/18/21 1606)  alum & mag hydroxide-simeth (MAALOX/MYLANTA) 200-200-20 MG/5ML suspension 30 mL (30 mLs Oral Given 12/18/21 1740)  morphine (PF) 4 MG/ML injection 4 mg (4 mg Intravenous Given 12/18/21 1605)  ondansetron (ZOFRAN) injection 4 mg (4 mg Intravenous Given 12/18/21 1606)  iohexol (OMNIPAQUE) 350 MG/ML injection 100 mL (100 mLs Intravenous Contrast Given 12/18/21 1552)    ED Course/ Medical Decision Making/ A&P Clinical Course as of 12/18/21 2115  Sun Dec 18, 2021  1530 He has had exacerbation of his chest pain is now 10/10 and radiates through to his "stomach and back."  He states the back is not as bad as the chest.  He feels nauseated at this time.  His wife is in the room.  I explained that so far his heart looks good.  We will add some more medications and evaluate for aortic dissection however I have very low suspicion at this time. [EW]    Clinical Course User Index [EW] Mancel BaleWentz, Matheu Ploeger, MD                           Medical Decision Making Patient presented for evaluation of chest pain.  He has history of coronary disease and status post stenting.  He states he has been taking his medications regularly.  He was initially felt to be code STEMI at an urgent care, review of tracings from there do not indicate code STEMI.  The EKG in the ED again does not show STEMI.  He has chronic right bundle branch block, unchanged from older EKGs.  Patient's pain improved with nitroglycerin.  Problems Addressed: Biliary calculus of other site without obstruction: undiagnosed new problem with uncertain prognosis Nonspecific chest pain: acute illness or injury with systemic symptoms Peripheral vascular disease (HCC): undiagnosed new problem with uncertain prognosis  Amount and/or Complexity of Data Reviewed Independent  Historian:     Details: He is a cogent historian External Data Reviewed: radiology and ECG.    Details: EKGs done prior to arrival at 1136 and 11:37 AM.  His EKGs do not indicate acute ST segment elevation MI.  Last cardiac nuclear medicine scan did not show reversible ischemia or altered ejection fraction; this was done in 2018. Labs: ordered.    Details: CBC, metabolic panel, troponin, lipase -- normal except glucose high, CO2 low Radiology: ordered and independent interpretation performed.    Details: Chest x-ray -- no infiltrate or edema Aortic dissection study -- no dissection, small aneurysm, inguinal arterial disease Abdominal ultrasound-uncomplicated  cholelithiasis ECG/medicine tests: ordered and independent interpretation performed.    Details: Cardiac monitor -- normal sinus rhythm  Risk OTC drugs. Prescription drug management. Decision regarding hospitalization. Risk Details: Patient presenting with nonspecific pain in chest and upper back.  Comprehensive evaluation done.  No evidence for ACS, pneumonia or PE.  No evidence for aortic dissection.  Incidental vascular disease noted.  He is referred back to his cardiologist for discussion of that.  His chest and back pain is likely due to esophagitis.  He is currently taking a PPI we will add Carafate.  He has incidental cholelithiasis without cholecystitis.  Is referred to GI and general surgery for evaluation of these problems.  He is stable for discharge does not require hospitalization.  Critical Care Total time providing critical care: 55 minutes           Final Clinical Impression(s) / ED Diagnoses Final diagnoses:  Nonspecific chest pain  Biliary calculus of other site without obstruction  Peripheral vascular disease (HCC)    Rx / DC Orders ED Discharge Orders          Ordered    oxyCODONE-acetaminophen (PERCOCET) 5-325 MG tablet  Every 4 hours PRN        12/18/21 1938    sucralfate (CARAFATE) 1 g tablet  3  times daily with meals & bedtime        12/18/21 1938              Mancel Bale, MD 12/18/21 2115

## 2021-12-18 NOTE — ED Notes (Signed)
[facility] at bed side for transport to ED for CP

## 2021-12-18 NOTE — ED Notes (Signed)
Pt reporting 10/10 chest pain with new back pain from initial symptoms. Pt also reporting with stomach pain and nausea. MD Effie Shy notified.

## 2021-12-18 NOTE — ED Notes (Signed)
PT belongings placed in bag with cell phone and wallet

## 2021-12-18 NOTE — ED Triage Notes (Signed)
Pt bib [facility] from [facility] c/o chest pain that started 3 hours ago. Pt states 8/10 midsternal pain that radiate to the left arm. Pt received 4 mg Zofran, 324 aspirin, and 2.4 nitro en route to hospital.   HX stents

## 2021-12-18 NOTE — ED Notes (Signed)
Pt left with [facility] to ed

## 2021-12-18 NOTE — Discharge Instructions (Signed)
Patient sent to the emergency department via [facility] for possible code STEMI.

## 2021-12-24 NOTE — ED Provider Notes (Signed)
Patient presents to urgent care for evaluation of 2-3 hour history of left sided chest discomfort that feels like a "heavy" sensation.  Chest pain is currently rated a 10 on a scale of 0-10 and is mainly to the left of the sternum and radiates to the left shoulder.  Pain is not reproducible with palpation on physical exam.  Patient states that he has a history of coronary artery disease and has 2 stents in his heart that were placed over 3 years ago.  Reports associated nausea, mild dizziness, and shortness of breath but denies emesis, abdominal discomfort, back pain, and syncope.  He attempted to relieve his pain with a couple of Pepcid tablets this morning but states that this did not help.  He does not currently take blood thinning medications and has not taken any nitroglycerin prior to arrival urgent care because he "lost his prescription".  Patient was at rest when the pain started and denies recent increase in physical activity.  Patient is guarding the left side of his chest and appears to be in significant discomfort in the triage room.  Normal breath sounds and heart sounds present to auscultation.  Respiratory effort is normal and airway is intact.  No pain elicited with palpation of the chest wall or epigastrium.  EKG shows normal sinus rhythm with a ventricular rate of 61 bpm.  There is ST depression to V4 and V5 that appears to be more prominent and changed from previous EKG in March 2022.  Discussed case with Ellsworth Lennox, PA who agrees that although this is not a classic STEMI, a code STEMI should be called due to ST changes and patient's clinical appearance.  Discussed recommendations and findings with patient who verbalizes understanding and agreement with plan for transport to the nearest emergency department. Code STEMI called.  [facility] arrived to transport patient to the emergency department.  Spoke with Dr. Allyson Sabal cardiologist who would like for patient to be evaluated in the emergency  department prior to canceling code STEMI as he agrees that this is not a typical/classic STEMI appearing EKG tracing.   Patient placed on oxygen, given nitroglycerin 0.4 mg, and given 4 mg of Zofran prior to leaving urgent care with [facility].  Patient discharged from urgent care with [facility] in stable condition.    Carlisle Beers, [place] 12/24/21 2127

## 2021-12-27 ENCOUNTER — Encounter: Payer: Self-pay | Admitting: General Surgery

## 2021-12-27 ENCOUNTER — Ambulatory Visit: Payer: Medicare Other | Admitting: General Surgery

## 2021-12-27 ENCOUNTER — Encounter: Payer: Self-pay | Admitting: *Deleted

## 2021-12-27 VITALS — BP 129/75 | HR 67 | Temp 97.9°F | Resp 12 | Ht 68.0 in | Wt 211.0 lb

## 2021-12-27 DIAGNOSIS — K8020 Calculus of gallbladder without cholecystitis without obstruction: Secondary | ICD-10-CM

## 2021-12-27 NOTE — H&P (Signed)
Jackson Porter [place]; [id number]; Mar 02, 1951   HPI Patient is a 71 year old white male who was referred to my care for evaluation and treatment of biliary colic secondary to cholelithiasis.  He was seen in the emergency room last week with epigastric pain.  While in the emergency room, he had a cardiac evaluation that was negative for myocardial infarction.  Further work-up revealed cholelithiasis.  Patient reports that he has been losing weight since that time due to a decreased appetite.  He describes his pain is right upper quadrant in nature, radiating to his right flank and right shoulder.  He does have nausea.  He denies any fever, chills, or jaundice.  The symptoms present spontaneously.  He does not report specific fatty food intolerance.  He states he saw his cardiologist within the last few months and has not had any cardiac issues recently.  He has had multiple surgical procedures without cardiac issues.  Patient has had some stomach issues and is on a PPI at the present time. Past Medical History:  Diagnosis Date   Coronary artery disease    Diabetes mellitus without complication (HCC)     Past Surgical History:  Procedure Laterality Date   APPENDECTOMY     CORONARY STENT PLACEMENT     HERNIA REPAIR     TONSILLECTOMY      History reviewed. No pertinent family history.  Current Outpatient Medications on File Prior to Visit  Medication Sig Dispense Refill   aspirin EC 81 MG tablet Take 81 mg by mouth daily. Swallow whole.     glimepiride (AMARYL) 4 MG tablet      ibuprofen (ADVIL) 200 MG tablet Take 200 mg by mouth every 6 (six) hours as needed.     JARDIANCE 10 MG TABS tablet      metoprolol succinate (TOPROL-XL) 50 MG 24 hr tablet      pantoprazole (PROTONIX) 40 MG tablet Take 1 tablet by mouth daily.     ramipril (ALTACE) 2.5 MG capsule Take 2.5 mg by mouth daily.     rosuvastatin (CRESTOR) 20 MG tablet      Specialty Vitamins Products (PROSTATE PO) Take by mouth.      VITAMIN D PO Take by mouth.     oxyCODONE-acetaminophen (PERCOCET) 5-325 MG tablet Take 1 tablet by mouth every 4 (four) hours as needed for severe pain. (Patient not taking: Reported on 12/27/2021) 20 tablet 0   sucralfate (CARAFATE) 1 g tablet Take 1 tablet (1 g total) by mouth 4 (four) times daily -  with meals and at bedtime. (Patient not taking: Reported on 12/27/2021) 120 tablet 0   No current facility-administered medications on file prior to visit.    Allergies  Allergen Reactions   Ibuprofen Other (See Comments)    Hives in high dosage    Social History   Substance and Sexual Activity  Alcohol Use No    Social History   Tobacco Use  Smoking Status Former  Smokeless Tobacco Never    Review of Systems  Constitutional:  Positive for diaphoresis and malaise/fatigue.  HENT:  Positive for sinus pain.   Eyes:  Positive for pain.  Respiratory: Negative.    Cardiovascular:  Positive for chest pain.  Gastrointestinal:  Positive for abdominal pain, heartburn and nausea.  Genitourinary:  Positive for frequency.  Musculoskeletal:  Positive for back pain, joint pain and neck pain.  Skin: Negative.   Neurological: Negative.   Endo/Heme/Allergies: Negative.   Psychiatric/Behavioral: Negative.      Objective  Vitals:   12/27/21 1050  BP: 129/75  Pulse: 67  Resp: 12  Temp: 97.9 F (36.6 C)  SpO2: 96%    Physical Exam Vitals reviewed.  Constitutional:      Appearance: Normal appearance. He is normal weight. He is not ill-appearing.  HENT:     Head: Normocephalic and atraumatic.  Eyes:     General: No scleral icterus. Cardiovascular:     Rate and Rhythm: Normal rate and regular rhythm.     Heart sounds: Normal heart sounds. No murmur heard.    No friction rub. No gallop.  Pulmonary:     Effort: Pulmonary effort is normal. No respiratory distress.     Breath sounds: Normal breath sounds. No stridor. No wheezing, rhonchi or rales.  Abdominal:     General: Bowel  sounds are normal. There is no distension.     Palpations: Abdomen is soft. There is no mass.     Tenderness: There is abdominal tenderness. There is no guarding or rebound.     Hernia: No hernia is present.     Comments: Patient does have discomfort to deep palpation in the right upper quadrant.  This reproduces his symptoms.  Skin:    General: Skin is warm and dry.  Neurological:     Mental Status: He is alert and oriented to person, place, and time.   Ultrasound shows cholelithiasis with a normal common bile duct. Primary care notes reviewed, ER note reviewed Assessment  Biliary colic secondary to cholelithiasis, may also have associated gastritis. Plan  Patient is scheduled for laparoscopic cholecystectomy on 01/02/2022.  The risks and benefits of the procedure including bleeding, infection, hepatobiliary injury, and the possibility of an open procedure were fully explained to the patient, who gave informed consent.  I will try to obtain notes from his cardiologist.

## 2021-12-27 NOTE — Progress Notes (Signed)
Jackson Porter; [id number]; 12/07/1950   HPI Patient is a 71-year-old white male who was referred to my care for evaluation and treatment of biliary colic secondary to cholelithiasis.  He was seen in the emergency room last week with epigastric pain.  While in the emergency room, he had a cardiac evaluation that was negative for myocardial infarction.  Further work-up revealed cholelithiasis.  Patient reports that he has been losing weight since that time due to a decreased appetite.  He describes his pain is right upper quadrant in nature, radiating to his right flank and right shoulder.  He does have nausea.  He denies any fever, chills, or jaundice.  The symptoms present spontaneously.  He does not report specific fatty food intolerance.  He states he saw his cardiologist within the last few months and has not had any cardiac issues recently.  He has had multiple surgical procedures without cardiac issues.  Patient has had some stomach issues and is on a PPI at the present time. Past Medical History:  Diagnosis Date   Coronary artery disease    Diabetes mellitus without complication (HCC)     Past Surgical History:  Procedure Laterality Date   APPENDECTOMY     CORONARY STENT PLACEMENT     HERNIA REPAIR     TONSILLECTOMY      History reviewed. No pertinent family history.  Current Outpatient Medications on File Prior to Visit  Medication Sig Dispense Refill   aspirin EC 81 MG tablet Take 81 mg by mouth daily. Swallow whole.     glimepiride (AMARYL) 4 MG tablet      ibuprofen (ADVIL) 200 MG tablet Take 200 mg by mouth every 6 (six) hours as needed.     JARDIANCE 10 MG TABS tablet      metoprolol succinate (TOPROL-XL) 50 MG 24 hr tablet      pantoprazole (PROTONIX) 40 MG tablet Take 1 tablet by mouth daily.     ramipril (ALTACE) 2.5 MG capsule Take 2.5 mg by mouth daily.     rosuvastatin (CRESTOR) 20 MG tablet      Specialty Vitamins Products (PROSTATE PO) Take by mouth.      VITAMIN D PO Take by mouth.     oxyCODONE-acetaminophen (PERCOCET) 5-325 MG tablet Take 1 tablet by mouth every 4 (four) hours as needed for severe pain. (Patient not taking: Reported on 12/27/2021) 20 tablet 0   sucralfate (CARAFATE) 1 g tablet Take 1 tablet (1 g total) by mouth 4 (four) times daily -  with meals and at bedtime. (Patient not taking: Reported on 12/27/2021) 120 tablet 0   No current facility-administered medications on file prior to visit.    Allergies  Allergen Reactions   Ibuprofen Other (See Comments)    Hives in high dosage    Social History   Substance and Sexual Activity  Alcohol Use No    Social History   Tobacco Use  Smoking Status Former  Smokeless Tobacco Never    Review of Systems  Constitutional:  Positive for diaphoresis and malaise/fatigue.  HENT:  Positive for sinus pain.   Eyes:  Positive for pain.  Respiratory: Negative.    Cardiovascular:  Positive for chest pain.  Gastrointestinal:  Positive for abdominal pain, heartburn and nausea.  Genitourinary:  Positive for frequency.  Musculoskeletal:  Positive for back pain, joint pain and neck pain.  Skin: Negative.   Neurological: Negative.   Endo/Heme/Allergies: Negative.   Psychiatric/Behavioral: Negative.      Objective     Vitals:   12/27/21 1050  BP: 129/75  Pulse: 67  Resp: 12  Temp: 97.9 F (36.6 C)  SpO2: 96%    Physical Exam Vitals reviewed.  Constitutional:      Appearance: Normal appearance. He is normal weight. He is not ill-appearing.  HENT:     Head: Normocephalic and atraumatic.  Eyes:     General: No scleral icterus. Cardiovascular:     Rate and Rhythm: Normal rate and regular rhythm.     Heart sounds: Normal heart sounds. No murmur heard.    No friction rub. No gallop.  Pulmonary:     Effort: Pulmonary effort is normal. No respiratory distress.     Breath sounds: Normal breath sounds. No stridor. No wheezing, rhonchi or rales.  Abdominal:     General: Bowel  sounds are normal. There is no distension.     Palpations: Abdomen is soft. There is no mass.     Tenderness: There is abdominal tenderness. There is no guarding or rebound.     Hernia: No hernia is present.     Comments: Patient does have discomfort to deep palpation in the right upper quadrant.  This reproduces his symptoms.  Skin:    General: Skin is warm and dry.  Neurological:     Mental Status: He is alert and oriented to person, place, and time.   Ultrasound shows cholelithiasis with a normal common bile duct. Primary care notes reviewed, ER note reviewed Assessment  Biliary colic secondary to cholelithiasis, may also have associated gastritis. Plan  Patient is scheduled for laparoscopic cholecystectomy on 01/02/2022.  The risks and benefits of the procedure including bleeding, infection, hepatobiliary injury, and the possibility of an open procedure were fully explained to the patient, who gave informed consent.  I will try to obtain notes from his cardiologist.

## 2021-12-29 ENCOUNTER — Other Ambulatory Visit: Payer: Self-pay

## 2021-12-29 ENCOUNTER — Encounter (HOSPITAL_COMMUNITY): Payer: Self-pay

## 2021-12-29 ENCOUNTER — Encounter (HOSPITAL_COMMUNITY)
Admission: RE | Admit: 2021-12-29 | Discharge: 2021-12-29 | Disposition: A | Discharge status: Home / Self Care | Payer: Medicare Other | Source: Ambulatory Visit | Attending: General Surgery | Admitting: General Surgery

## 2021-12-29 HISTORY — DX: Acute myocardial infarction, unspecified: I21.9

## 2021-12-29 NOTE — Pre-Procedure Instructions (Signed)
Left VM for Jackson Porter to see if the office had received cardiology note mentioned in Dr Lovell Sheehan H&P.

## 2022-01-02 ENCOUNTER — Encounter (HOSPITAL_COMMUNITY): Payer: Self-pay | Admitting: General Surgery

## 2022-01-02 ENCOUNTER — Other Ambulatory Visit: Payer: Self-pay

## 2022-01-02 ENCOUNTER — Ambulatory Visit (HOSPITAL_BASED_OUTPATIENT_CLINIC_OR_DEPARTMENT_OTHER): Payer: Medicare Other | Admitting: Certified Registered Nurse Anesthetist

## 2022-01-02 ENCOUNTER — Encounter (HOSPITAL_COMMUNITY)
Admission: RE | Disposition: A | Discharge status: Home / Self Care | Payer: Self-pay | Source: Home / Self Care | Attending: General Surgery

## 2022-01-02 ENCOUNTER — Ambulatory Visit (HOSPITAL_COMMUNITY): Payer: Medicare Other | Admitting: Certified Registered Nurse Anesthetist

## 2022-01-02 ENCOUNTER — Ambulatory Visit (HOSPITAL_COMMUNITY)
Admission: RE | Admit: 2022-01-02 | Discharge: 2022-01-02 | Disposition: A | Discharge status: Home / Self Care | Payer: Medicare Other | Attending: General Surgery | Admitting: General Surgery

## 2022-01-02 DIAGNOSIS — K8064 Calculus of gallbladder and bile duct with chronic cholecystitis without obstruction: Secondary | ICD-10-CM | POA: Diagnosis present

## 2022-01-02 DIAGNOSIS — Z79899 Other long term (current) drug therapy: Secondary | ICD-10-CM | POA: Insufficient documentation

## 2022-01-02 DIAGNOSIS — I252 Old myocardial infarction: Secondary | ICD-10-CM | POA: Diagnosis not present

## 2022-01-02 DIAGNOSIS — Z7984 Long term (current) use of oral hypoglycemic drugs: Secondary | ICD-10-CM

## 2022-01-02 DIAGNOSIS — K810 Acute cholecystitis: Secondary | ICD-10-CM

## 2022-01-02 DIAGNOSIS — I2510 Atherosclerotic heart disease of native coronary artery without angina pectoris: Secondary | ICD-10-CM | POA: Diagnosis not present

## 2022-01-02 DIAGNOSIS — Z87891 Personal history of nicotine dependence: Secondary | ICD-10-CM | POA: Insufficient documentation

## 2022-01-02 DIAGNOSIS — E119 Type 2 diabetes mellitus without complications: Secondary | ICD-10-CM | POA: Diagnosis not present

## 2022-01-02 DIAGNOSIS — K8000 Calculus of gallbladder with acute cholecystitis without obstruction: Secondary | ICD-10-CM

## 2022-01-02 HISTORY — PX: CHOLECYSTECTOMY: SHX55

## 2022-01-02 LAB — GLUCOSE, CAPILLARY: Glucose-Capillary: 155 mg/dL — ABNORMAL HIGH (ref 70–99)

## 2022-01-02 SURGERY — LAPAROSCOPIC CHOLECYSTECTOMY
Anesthesia: General | Site: Abdomen

## 2022-01-02 MED ORDER — FENTANYL CITRATE PF 50 MCG/ML IJ SOSY
25.0000 ug | PREFILLED_SYRINGE | INTRAMUSCULAR | Status: DC | PRN
  Administered 2022-01-02: 50 ug via INTRAVENOUS
  Filled 2022-01-02 (×2): qty 1

## 2022-01-02 MED ORDER — FENTANYL CITRATE (PF) 250 MCG/5ML IJ SOLN
INTRAMUSCULAR | Status: AC
  Filled 2022-01-02: qty 5

## 2022-01-02 MED ORDER — OXYCODONE HCL 5 MG/5ML PO SOLN: 5.0000 mg | Freq: Once | ORAL | Status: AC | PRN

## 2022-01-02 MED ORDER — LABETALOL HCL 5 MG/ML IV SOLN
INTRAVENOUS | Status: DC | PRN
  Administered 2022-01-02: 5 mg via INTRAVENOUS

## 2022-01-02 MED ORDER — SUGAMMADEX SODIUM 200 MG/2ML IV SOLN
INTRAVENOUS | Status: DC | PRN
  Administered 2022-01-02: 200 mg via INTRAVENOUS

## 2022-01-02 MED ORDER — PROPOFOL 10 MG/ML IV BOLUS
INTRAVENOUS | Status: DC | PRN
  Administered 2022-01-02: 150 mg via INTRAVENOUS

## 2022-01-02 MED ORDER — CHLORHEXIDINE GLUCONATE CLOTH 2 % EX PADS: 6.0000 | MEDICATED_PAD | Freq: Once | CUTANEOUS | Status: DC

## 2022-01-02 MED ORDER — ROCURONIUM BROMIDE 10 MG/ML (PF) SYRINGE
PREFILLED_SYRINGE | INTRAVENOUS | Status: AC
  Filled 2022-01-02: qty 10

## 2022-01-02 MED ORDER — OXYCODONE HCL 5 MG PO TABS
5.0000 mg | ORAL_TABLET | Freq: Once | ORAL | Status: AC | PRN
  Administered 2022-01-02: 5 mg via ORAL
  Filled 2022-01-02: qty 1

## 2022-01-02 MED ORDER — SODIUM CHLORIDE 0.9 % IR SOLN
Status: DC | PRN
  Administered 2022-01-02: 1000 mL
  Administered 2022-01-02: 3000 mL

## 2022-01-02 MED ORDER — ROCURONIUM BROMIDE 10 MG/ML (PF) SYRINGE
PREFILLED_SYRINGE | INTRAVENOUS | Status: DC | PRN
  Administered 2022-01-02: 50 mg via INTRAVENOUS
  Administered 2022-01-02: 10 mg via INTRAVENOUS

## 2022-01-02 MED ORDER — LACTATED RINGERS IV SOLN
INTRAVENOUS | Status: DC
  Administered 2022-01-02: 1000 mL via INTRAVENOUS

## 2022-01-02 MED ORDER — ONDANSETRON HCL 4 MG/2ML IJ SOLN
INTRAMUSCULAR | Status: AC
  Filled 2022-01-02: qty 2

## 2022-01-02 MED ORDER — OXYCODONE-ACETAMINOPHEN 5-325 MG PO TABS: 1.0000 | ORAL_TABLET | ORAL | 0 refills | Status: DC | PRN

## 2022-01-02 MED ORDER — ONDANSETRON HCL 4 MG/2ML IJ SOLN
4.0000 mg | Freq: Once | INTRAMUSCULAR | Status: AC | PRN
  Administered 2022-01-02: 4 mg via INTRAVENOUS
  Filled 2022-01-02: qty 2

## 2022-01-02 MED ORDER — HEMOSTATIC AGENTS (NO CHARGE) OPTIME
TOPICAL | Status: DC | PRN
  Administered 2022-01-02 (×2): 1

## 2022-01-02 MED ORDER — ONDANSETRON HCL 4 MG/2ML IJ SOLN
INTRAMUSCULAR | Status: DC | PRN
  Administered 2022-01-02: 4 mg via INTRAVENOUS

## 2022-01-02 MED ORDER — LIDOCAINE HCL (PF) 2 % IJ SOLN
INTRAMUSCULAR | Status: AC
  Filled 2022-01-02: qty 5

## 2022-01-02 MED ORDER — BUPIVACAINE LIPOSOME 1.3 % IJ SUSP
INTRAMUSCULAR | Status: DC | PRN
  Administered 2022-01-02: 20 mL

## 2022-01-02 MED ORDER — ORAL CARE MOUTH RINSE: 15.0000 mL | Freq: Once | OROMUCOSAL | Status: AC

## 2022-01-02 MED ORDER — BUPIVACAINE LIPOSOME 1.3 % IJ SUSP
INTRAMUSCULAR | Status: AC
  Filled 2022-01-02: qty 20

## 2022-01-02 MED ORDER — FENTANYL CITRATE (PF) 100 MCG/2ML IJ SOLN
INTRAMUSCULAR | Status: AC
  Filled 2022-01-02: qty 2

## 2022-01-02 MED ORDER — LIDOCAINE HCL (CARDIAC) PF 100 MG/5ML IV SOSY
PREFILLED_SYRINGE | INTRAVENOUS | Status: DC | PRN
  Administered 2022-01-02: 60 mg via INTRATRACHEAL

## 2022-01-02 MED ORDER — CHLORHEXIDINE GLUCONATE 0.12 % MT SOLN
15.0000 mL | Freq: Once | OROMUCOSAL | Status: AC
  Administered 2022-01-02: 15 mL via OROMUCOSAL

## 2022-01-02 MED ORDER — FENTANYL CITRATE (PF) 100 MCG/2ML IJ SOLN
INTRAMUSCULAR | Status: DC | PRN
  Administered 2022-01-02 (×2): 50 ug via INTRAVENOUS
  Administered 2022-01-02: 100 ug via INTRAVENOUS
  Administered 2022-01-02: 50 ug via INTRAVENOUS

## 2022-01-02 MED ORDER — CEFAZOLIN SODIUM-DEXTROSE 2-4 GM/100ML-% IV SOLN
2.0000 g | INTRAVENOUS | Status: AC
  Administered 2022-01-02: 2 g via INTRAVENOUS
  Filled 2022-01-02: qty 100

## 2022-01-02 SURGICAL SUPPLY — 51 items
APPLIER CLIP ROT 10 11.4 M/L (STAPLE) ×1
CHLORAPREP W/TINT 26 (MISCELLANEOUS) ×1 IMPLANT
CLIP APPLIE ROT 10 11.4 M/L (STAPLE) ×1 IMPLANT
CLOTH BEACON ORANGE TIMEOUT ST (SAFETY) ×1 IMPLANT
COVER LIGHT HANDLE STERIS (MISCELLANEOUS) ×2 IMPLANT
CUTTER FLEX LINEAR 45M (STAPLE) IMPLANT
DERMABOND ADVANCED (GAUZE/BANDAGES/DRESSINGS) ×1
DERMABOND ADVANCED .7 DNX12 (GAUZE/BANDAGES/DRESSINGS) ×1 IMPLANT
ELECT REM PT RETURN 9FT ADLT (ELECTROSURGICAL) ×1
ELECTRODE REM PT RTRN 9FT ADLT (ELECTROSURGICAL) ×1 IMPLANT
GLOVE BIO SURGEON STRL SZ7 (GLOVE) IMPLANT
GLOVE BIO SURGEON STRL SZ8 (GLOVE) IMPLANT
GLOVE BIOGEL PI IND STRL 6.5 (GLOVE) IMPLANT
GLOVE BIOGEL PI IND STRL 7.0 (GLOVE) ×2 IMPLANT
GLOVE BIOGEL PI IND STRL 8.5 (GLOVE) IMPLANT
GLOVE BIOGEL PI INDICATOR 6.5 (GLOVE) ×1
GLOVE BIOGEL PI INDICATOR 7.0 (GLOVE) ×3
GLOVE BIOGEL PI INDICATOR 8.5 (GLOVE) ×1
GLOVE SURG SS PI 6.5 STRL IVOR (GLOVE) IMPLANT
GLOVE SURG SS PI 7.5 STRL IVOR (GLOVE) ×1 IMPLANT
GOWN STRL REUS W/TWL LRG LVL3 (GOWN DISPOSABLE) ×3 IMPLANT
HEMOSTAT SNOW SURGICEL 2X4 (HEMOSTASIS) ×1 IMPLANT
INST SET LAPROSCOPIC AP (KITS) ×1 IMPLANT
IV NS IRRIG 3000ML ARTHROMATIC (IV SOLUTION) IMPLANT
KIT TURNOVER KIT A (KITS) ×1 IMPLANT
MANIFOLD NEPTUNE II (INSTRUMENTS) ×1 IMPLANT
NDL HYPO 21X1.5 SAFETY (NEEDLE) ×1 IMPLANT
NDL INSUFFLATION 14GA 120MM (NEEDLE) ×1 IMPLANT
NEEDLE HYPO 21X1.5 SAFETY (NEEDLE) ×1 IMPLANT
NEEDLE INSUFFLATION 14GA 120MM (NEEDLE) ×1 IMPLANT
NS IRRIG 1000ML POUR BTL (IV SOLUTION) ×1 IMPLANT
PACK LAP CHOLE LZT030E (CUSTOM PROCEDURE TRAY) ×1 IMPLANT
PAD ARMBOARD 7.5X6 YLW CONV (MISCELLANEOUS) ×1 IMPLANT
POWDER SURGICEL 3.0 GRAM (HEMOSTASIS) IMPLANT
RELOAD STAPLE 45 3.5 BLU ETS (ENDOMECHANICALS) IMPLANT
RELOAD STAPLE TA45 3.5 REG BLU (ENDOMECHANICALS) ×1 IMPLANT
SET BASIN LINEN APH (SET/KITS/TRAYS/PACK) ×1 IMPLANT
SET TUBE IRRIG SUCTION NO TIP (IRRIGATION / IRRIGATOR) IMPLANT
SET TUBE SMOKE EVAC HIGH FLOW (TUBING) ×1 IMPLANT
SLEEVE Z-THREAD 5X100MM (TROCAR) ×1 IMPLANT
SUT MNCRL AB 4-0 PS2 18 (SUTURE) ×2 IMPLANT
SUT VICRYL 0 UR6 27IN ABS (SUTURE) ×1 IMPLANT
SYR 20ML LL LF (SYRINGE) ×2 IMPLANT
SYS BAG RETRIEVAL 10MM (BASKET) ×1
SYSTEM BAG RETRIEVAL 10MM (BASKET) ×1 IMPLANT
TIP ENDOSCOPIC SURGICEL (TIP) IMPLANT
TROCAR Z-THRD FIOS HNDL 11X100 (TROCAR) ×1 IMPLANT
TROCAR Z-THREAD FIOS 5X100MM (TROCAR) ×1 IMPLANT
TROCAR Z-THREAD SLEEVE 11X100 (TROCAR) ×1 IMPLANT
TUBE CONNECTING 12X1/4 (SUCTIONS) ×1 IMPLANT
WARMER LAPAROSCOPE (MISCELLANEOUS) ×1 IMPLANT

## 2022-01-02 NOTE — Anesthesia Postprocedure Evaluation (Signed)
Anesthesia Post Note  Patient: Jackson Porter [facility]  Procedure(s) Performed: LAPAROSCOPIC CHOLECYSTECTOMY (Abdomen)  Patient location during evaluation: Phase II Anesthesia Type: General Level of consciousness: awake Pain management: pain level controlled Vital Signs Assessment: post-procedure vital signs reviewed and stable Respiratory status: spontaneous breathing and respiratory function stable Cardiovascular status: blood pressure returned to baseline and stable Postop Assessment: no headache and no apparent nausea or vomiting Anesthetic complications: no Comments: Late entry   No notable events documented.   Last Vitals:  Vitals:   01/02/22 1155 01/02/22 1212  BP:  (!) 169/102  Pulse: 63 68  Resp: 14 10  Temp:    SpO2: 94% 93%    Last Pain:  Vitals:   01/02/22 1155  TempSrc:   PainSc: 8                  Windell Norfolk

## 2022-01-02 NOTE — Anesthesia Preprocedure Evaluation (Signed)
Anesthesia Evaluation  Patient identified by MRN, date of birth, ID band Patient awake    Reviewed: Allergy & Precautions, H&P , NPO status , Patient's Chart, lab work & pertinent test results, reviewed documented beta blocker date and time   Airway Mallampati: II  TM Distance: >3 FB Neck ROM: full    Dental no notable dental hx.    Pulmonary neg pulmonary ROS, former smoker,    Pulmonary exam normal breath sounds clear to auscultation       Cardiovascular Exercise Tolerance: Good + CAD and + Past MI   Rhythm:regular Rate:Normal     Neuro/Psych negative neurological ROS  negative psych ROS   GI/Hepatic negative GI ROS, Neg liver ROS,   Endo/Other  negative endocrine ROSdiabetes, Type 2  Renal/GU negative Renal ROS  negative genitourinary   Musculoskeletal   Abdominal   Peds  Hematology negative hematology ROS (+)   Anesthesia Other Findings   Reproductive/Obstetrics negative OB ROS                             Anesthesia Physical Anesthesia Plan  ASA: 3  Anesthesia Plan: General and General ETT   Post-op Pain Management:    Induction:   PONV Risk Score and Plan: Ondansetron  Airway Management Planned:   Additional Equipment:   Intra-op Plan:   Post-operative Plan:   Informed Consent: I have reviewed the patients History and Physical, chart, labs and discussed the procedure including the risks, benefits and alternatives for the proposed anesthesia with the patient or authorized representative who has indicated his/her understanding and acceptance.     Dental Advisory Given  Plan Discussed with: CRNA  Anesthesia Plan Comments:         Anesthesia Quick Evaluation

## 2022-01-02 NOTE — Transfer of Care (Signed)
Immediate Anesthesia Transfer of Care Note  Patient: Conley Delisle [facility]  Procedure(s) Performed: LAPAROSCOPIC CHOLECYSTECTOMY (Abdomen)  Patient Location: PACU  Anesthesia Type:General  Level of Consciousness: awake  Airway & Oxygen Therapy: Patient Spontanous Breathing and Patient connected to nasal cannula oxygen  Post-op Assessment: Report given to RN and Post -op Vital signs reviewed and stable  Post vital signs: Reviewed and stable  Last Vitals:  Vitals Value Taken Time  BP 186/113 01/02/22 1119  Temp 36.6 C 01/02/22 1121  Pulse 80 01/02/22 1121  Resp 16 01/02/22 1121  SpO2 91 % 01/02/22 1121  Vitals shown include unvalidated device data.  Last Pain:  Vitals:   01/02/22 0800  TempSrc: Oral  PainSc: 0-No pain      Patients Stated Pain Goal: 9 (01/02/22 0800)  Complications: No notable events documented.

## 2022-01-02 NOTE — Op Note (Signed)
Patient:  Jackson Porter  DOB:  1950-07-29  MRN:  [id number]   Preop Diagnosis: Cholecystitis, cholelithiasis  Postop Diagnosis: Same, empyema of gallbladder  Procedure: Laparoscopic cholecystectomy  Surgeon: [name], MD  Anes: General endotracheal  Indications: Patient is a 71 year old white male who presents with cholecystitis secondary to cholelithiasis.  The risks and benefits of the procedure including bleeding, infection, hepatobiliary injury, and the possibility of an open procedure were fully explained to the patient, who gave informed consent.  Procedure note: The patient was placed in the supine position.  After induction of general endotracheal anesthesia, the abdomen was prepped and draped using the usual sterile technique with ChloraPrep.  Surgical site confirmation was performed.  A supraumbilical incision was made down to the fascia.  A Veress needle was introduced into the abdominal cavity and confirmation of placement was done using the saline drop test.  The abdomen was then insufflated to 15 mmHg pressure.  An 11 mm trocar was introduced into the abdominal cavity under direct visualization without difficulty.  The patient was placed in reverse Trendelenburg position and an additional 11 mm trocar was placed in the epigastric region and 5 mm trocars were placed the right upper quadrant and right flank regions.  Liver was inspected and noted to be within normal limits.  The gallbladder was noted to be very thickened with a distended lumen.  It was elected to proceed with decompression of the gallbladder.  Upon decompressing the gallbladder, purulent fluid was noted within the gallbladder, consistent with an empyema.  The gallbladder was then retracted in a dynamic fashion in order to provide a critical view of the triangle of Calot.  The cystic duct was fully identified.  Its juncture to the infundibulum was fully identified.  Given the inflammation and the size of the  cystic duct, it was elected to proceed with division by the standard Endo GIA.  The cystic artery was ligated and divided using clips.  The gallbladder was then freed away from the gallbladder fossa using Bovie electrocautery.  The gallbladder was delivered through the epigastric trocar site using an Endo Catch bag.  The gallbladder fossa was inspected and no abnormal bleeding or bile leakage was noted.  Surgicel powder and snow were then placed in the gallbladder fossa.  All fluid and air were then evacuated from the abdominal cavity prior to the removal of the trocars.  All wounds were irrigated with normal saline.  All wounds were injected with Exparel.  The epigastric fascia was reapproximated using an 0 Vicryl interrupted suture.  All skin incisions were closed using a 4-0 Monocryl subcuticular suture.  Dermabond was applied.  All tape and needle counts were correct at the end of the procedure.  Patient was extubated in the operating room and transferred to PACU in stable condition.  Complications: None  EBL: 250 cc  Specimen: Gallbladder

## 2022-01-02 NOTE — Anesthesia Procedure Notes (Signed)
Procedure Name: Intubation Date/Time: 01/02/2022 9:38 AM  Performed by: Karna Dupes, CRNAPre-anesthesia Checklist: Patient identified, Emergency Drugs available, Suction available and Patient being monitored Patient Re-evaluated:Patient Re-evaluated prior to induction Oxygen Delivery Method: Circle system utilized Preoxygenation: Pre-oxygenation with 100% oxygen Induction Type: IV induction Ventilation: Mask ventilation without difficulty Laryngoscope Size: Mac and 4 Grade View: Grade I Tube type: Oral Number of attempts: 1 Airway Equipment and Method: Stylet Placement Confirmation: ETT inserted through vocal cords under direct vision, positive ETCO2 and breath sounds checked- equal and bilateral Secured at: 22 cm Tube secured with: Tape Dental Injury: Teeth and Oropharynx as per pre-operative assessment

## 2022-01-02 NOTE — Interval H&P Note (Signed)
History and Physical Interval Note:  01/02/2022 8:48 AM  Jackson Porter  has presented today for surgery, with the diagnosis of CHOLELITHIASIS.  The various methods of treatment have been discussed with the patient and family. After consideration of risks, benefits and other options for treatment, the patient has consented to  Procedure(s): LAPAROSCOPIC CHOLECYSTECTOMY (N/A) as a surgical intervention.  The patient's history has been reviewed, patient examined, no change in status, stable for surgery.  I have reviewed the patient's chart and labs.  Questions were answered to the patient's satisfaction.     [name]

## 2022-01-06 LAB — SURGICAL PATHOLOGY

## 2022-01-09 ENCOUNTER — Encounter (HOSPITAL_COMMUNITY): Payer: Self-pay | Admitting: General Surgery

## 2022-01-12 ENCOUNTER — Ambulatory Visit (INDEPENDENT_AMBULATORY_CARE_PROVIDER_SITE_OTHER): Payer: Medicare Other | Admitting: General Surgery

## 2022-01-12 ENCOUNTER — Encounter: Payer: Self-pay | Admitting: General Surgery

## 2022-01-12 DIAGNOSIS — Z09 Encounter for follow-up examination after completed treatment for conditions other than malignant neoplasm: Secondary | ICD-10-CM

## 2022-01-12 NOTE — Progress Notes (Signed)
Subjective:     Jackson Porter [facility]  Virtual telephone postoperative visit performed with patient.  He states that he did not tolerate the oxycodone because it made him very sleepy.  He did have some fatigue and chills early after the surgery, but he states the area is improving.  I told him that he had a badly inflamed gallbladder and was not surprised that he had a couple of days of generalized malaise.  He currently denies any fever, chills, or jaundice. Objective:    There were no vitals taken for this visit.  General:  alert and cooperative  Final pathology reviewed     Assessment:    Doing well postoperatively.    Plan:   I told him to call me should he have any setbacks.  He understands and agrees.  Follow-up here as needed.  As this was a part of the global surgical fee, this was not a billable visit.  Total telephone time was 2-1/2 minutes.

## 2022-10-21 ENCOUNTER — Observation Stay (HOSPITAL_COMMUNITY)
Admission: EM | Admit: 2022-10-21 | Discharge: 2022-10-23 | Disposition: A | Discharge status: Home / Self Care | Payer: Medicare Other | Attending: Internal Medicine | Admitting: Internal Medicine

## 2022-10-21 ENCOUNTER — Other Ambulatory Visit: Payer: Self-pay

## 2022-10-21 ENCOUNTER — Emergency Department (HOSPITAL_COMMUNITY): Payer: Medicare Other

## 2022-10-21 ENCOUNTER — Encounter (HOSPITAL_COMMUNITY): Payer: Self-pay

## 2022-10-21 DIAGNOSIS — R079 Chest pain, unspecified: Secondary | ICD-10-CM | POA: Diagnosis not present

## 2022-10-21 DIAGNOSIS — I10 Essential (primary) hypertension: Secondary | ICD-10-CM | POA: Insufficient documentation

## 2022-10-21 DIAGNOSIS — E876 Hypokalemia: Secondary | ICD-10-CM | POA: Insufficient documentation

## 2022-10-21 DIAGNOSIS — I7143 Infrarenal abdominal aortic aneurysm, without rupture: Secondary | ICD-10-CM | POA: Diagnosis not present

## 2022-10-21 DIAGNOSIS — R0789 Other chest pain: Principal | ICD-10-CM | POA: Insufficient documentation

## 2022-10-21 DIAGNOSIS — Z87891 Personal history of nicotine dependence: Secondary | ICD-10-CM | POA: Insufficient documentation

## 2022-10-21 DIAGNOSIS — I2510 Atherosclerotic heart disease of native coronary artery without angina pectoris: Secondary | ICD-10-CM | POA: Diagnosis not present

## 2022-10-21 DIAGNOSIS — Z7982 Long term (current) use of aspirin: Secondary | ICD-10-CM | POA: Insufficient documentation

## 2022-10-21 DIAGNOSIS — Z955 Presence of coronary angioplasty implant and graft: Secondary | ICD-10-CM | POA: Diagnosis not present

## 2022-10-21 DIAGNOSIS — Z79899 Other long term (current) drug therapy: Secondary | ICD-10-CM | POA: Insufficient documentation

## 2022-10-21 DIAGNOSIS — R809 Proteinuria, unspecified: Secondary | ICD-10-CM

## 2022-10-21 DIAGNOSIS — E119 Type 2 diabetes mellitus without complications: Secondary | ICD-10-CM | POA: Diagnosis not present

## 2022-10-21 LAB — CBC
HCT: 49.2 % (ref 39.0–52.0)
Hemoglobin: 16.7 g/dL (ref 13.0–17.0)
MCH: 29.2 pg (ref 26.0–34.0)
MCHC: 33.9 g/dL (ref 30.0–36.0)
MCV: 86.2 fL (ref 80.0–100.0)
Platelets: 157 10*3/uL (ref 150–400)
RBC: 5.71 MIL/uL (ref 4.22–5.81)
RDW: 13.1 % (ref 11.5–15.5)
WBC: 12 10*3/uL — ABNORMAL HIGH (ref 4.0–10.5)
nRBC: 0 % (ref 0.0–0.2)

## 2022-10-21 LAB — BASIC METABOLIC PANEL
Anion gap: 13 (ref 5–15)
BUN: 12 mg/dL (ref 8–23)
CO2: 20 mmol/L — ABNORMAL LOW (ref 22–32)
Calcium: 9.2 mg/dL (ref 8.9–10.3)
Chloride: 106 mmol/L (ref 98–111)
Creatinine, Ser: 1.26 mg/dL — ABNORMAL HIGH (ref 0.61–1.24)
GFR, Estimated: >60 mL/min (ref >60)
Glucose, Bld: 185 mg/dL — ABNORMAL HIGH (ref 70–99)
Potassium: 3.3 mmol/L — ABNORMAL LOW (ref 3.5–5.1)
Sodium: 139 mmol/L (ref 135–145)

## 2022-10-21 LAB — TROPONIN I (HIGH SENSITIVITY)
Troponin I (High Sensitivity): 11 ng/L (ref <18)
Troponin I (High Sensitivity): 15 ng/L (ref <18)

## 2022-10-21 LAB — D-DIMER, QUANTITATIVE: D-Dimer, Quant: 0.8 ug/mL-FEU — ABNORMAL HIGH (ref 0.00–0.50)

## 2022-10-21 MED ORDER — IOHEXOL 350 MG/ML SOLN
100.0000 mL | Freq: Once | INTRAVENOUS | Status: AC | PRN
  Administered 2022-10-21: 100 mL via INTRAVENOUS

## 2022-10-21 MED ORDER — ASPIRIN 81 MG PO CHEW
243.0000 mg | CHEWABLE_TABLET | Freq: Once | ORAL | Status: AC
  Administered 2022-10-21: 243 mg via ORAL
  Filled 2022-10-21: qty 3

## 2022-10-21 NOTE — ED Triage Notes (Signed)
Pt c/o dizziness, throat tightness, chest pain, pain to posterior head x 4 hours; pt took 81 mg asa pta; chest pain central, aching and heavy; endorses some sob and nausea; skin warm and dry; pt working in his shop at time of onset; hx cardiac stent placement, DM; pt speaking in full sentences without issue

## 2022-10-21 NOTE — ED Provider Notes (Signed)
[place] EMERGENCY DEPARTMENT AT [facility] Provider Note   CSN: [id number] Arrival date & time: 10/21/22  1810     History  Chief Complaint  Patient presents with   Chest Pain    SHINE MIKES is a 72 y.o. male.  HPI Patient presents with his wife due to chest pain.  Where the patient has been doing generally well today, without substantial activity he developed chest pain, anterior upper chest, rating to the back and the neck.  No weakness, numbness in his extremities, no vision loss, or syncope.  Symptoms were persistent, with minimal improvement with aspirin. He notes history of 3 prior stents, states that he has been compliant with this medication. He sees his cardiologist every 3 months.     Home Medications Prior to Admission medications   Medication Sig Start Date End Date Taking? Authorizing Provider  aspirin EC 81 MG tablet Take 81 mg by mouth daily. Swallow whole.    [provider]  glimepiride (AMARYL) 4 MG tablet  07/18/21   [provider]  ibuprofen (ADVIL) 200 MG tablet Take 200 mg by mouth every 6 (six) hours as needed.    [provider]  JARDIANCE 10 MG TABS tablet  01/24/19   [provider]  metoprolol succinate (TOPROL-XL) 50 MG 24 hr tablet  01/21/19   [provider]  oxyCODONE-acetaminophen (PERCOCET) 5-325 MG tablet Take 1 tablet by mouth every 4 (four) hours as needed for severe pain. 01/02/22 01/02/23  [name], MD  pantoprazole (PROTONIX) 40 MG tablet Take 1 tablet by mouth daily.    [provider]  ramipril (ALTACE) 2.5 MG capsule Take 2.5 mg by mouth daily.    [provider]  rosuvastatin (CRESTOR) 20 MG tablet  11/18/18   [provider]  Specialty Vitamins Products (PROSTATE PO) Take by mouth.    [provider]  sucralfate (CARAFATE) 1 g tablet Take 1 tablet (1 g total) by mouth 4 (four) times daily -  with meals and at bedtime. 12/18/21   Mancel Bale, MD  VITAMIN D PO Take by mouth.    [provider]      Allergies    Ibuprofen    Review of Systems   Review of Systems  All other systems reviewed and are negative.   Physical Exam Updated Vital Signs BP (!) 152/85   Pulse (!) 108   Temp 98.7 F (37.1 C)   Resp 17   Ht 5\' 8"  (1.727 m)   Wt 94.3 kg   SpO2 96%   BMI 31.63 kg/m  Physical Exam Vitals and nursing note reviewed.  Constitutional:      Appearance: He is well-developed. He is ill-appearing.  HENT:     Head: Normocephalic and atraumatic.  Eyes:     Conjunctiva/sclera: Conjunctivae normal.  Cardiovascular:     Rate and Rhythm: Normal rate and regular rhythm.  Pulmonary:     Effort: Pulmonary effort is normal. No respiratory distress.     Breath sounds: No stridor.  Abdominal:     General: There is no distension.  Skin:    General: Skin is warm.  Neurological:     Mental Status: He is alert and oriented to person, place, and time.     ED Results / Procedures / Treatments   Labs (all labs ordered are listed, but only abnormal results are displayed) Labs Reviewed  BASIC METABOLIC PANEL - Abnormal; Notable for the following components:  Result Value   Potassium 3.3 (*)    CO2 20 (*)    Glucose, Bld 185 (*)    Creatinine, Ser 1.26 (*)    All other components within normal limits  CBC - Abnormal; Notable for the following components:   WBC 12.0 (*)    All other components within normal limits  D-DIMER, QUANTITATIVE - Abnormal; Notable for the following components:   D-Dimer, Quant 0.80 (*)    All other components within normal limits  TROPONIN I (HIGH SENSITIVITY)  TROPONIN I (HIGH SENSITIVITY)    EKG EKG Interpretation  Date/Time:  Saturday October 21 2022 20:59:46 EDT Ventricular Rate:  83 PR Interval:  176 QRS Duration: 147 QT Interval:  399 QTC Calculation: 469 R Axis:   -79 Text Interpretation: Sinus rhythm Right bundle branch block ST-t wave abnormality Abnormal  ECG Confirmed by Gerhard Munch [phone]) on 10/21/2022 10:44:51 PM  Radiology CT Angio Chest/Abd/Pel for Dissection W and/or Wo Contrast  Result Date: 10/21/2022 CLINICAL DATA:  Dizziness, throat tightness, chest pain EXAM: CT ANGIOGRAPHY CHEST, ABDOMEN AND PELVIS TECHNIQUE: Non-contrast CT of the chest was initially obtained. Multidetector CT imaging through the chest, abdomen and pelvis was performed using the standard protocol during bolus administration of intravenous contrast. Multiplanar reconstructed images and MIPs were obtained and reviewed to evaluate the vascular anatomy. RADIATION DOSE REDUCTION: This exam was performed according to the departmental dose-optimization program which includes automated exposure control, adjustment of the mA and/or kV according to patient size and/or use of iterative reconstruction technique. CONTRAST:  OMNIPAQUE IOHEXOL 350 MG/ML SOLN COMPARISON:  Chest radiographs 10/21/2022 and CTA chest abdomen and pelvis 12/18/2021 FINDINGS: CTA CHEST FINDINGS Cardiovascular: Coronary artery and aortic atherosclerotic calcification. No aortic aneurysm, dissection, or intramural hematoma. No pericardial effusion. Mediastinum/Nodes: Unremarkable trachea and esophagus. No thoracic adenopathy. Lungs/Pleura: Normal variant azygous lobe. No focal consolidation, pleural effusion, or pneumothorax. Musculoskeletal: No acute fracture. Review of the MIP images confirms the above findings. CTA ABDOMEN AND PELVIS FINDINGS VASCULAR Aorta: 3.3 cm infrarenal abdominal aorta or aortic aneurysm with eccentric thrombus. No dissection. Calcified atherosclerotic plaque. No significant stenosis. Celiac: Patent without evidence of aneurysm, dissection, vasculitis or significant stenosis. SMA: Patent without evidence of aneurysm, dissection, vasculitis or significant stenosis. Renals: Both renal arteries are patent without evidence of aneurysm, dissection, vasculitis, fibromuscular dysplasia or  significant stenosis. IMA: Patent. Inflow: Calcified atherosclerotic plaque with multifocal areas of narrowing greatest at the origin of the right common iliac artery and mid left common iliac artery where it is severe no severe. No aneurysm or dissection Veins: No obvious venous abnormality within the limitations of this arterial phase study. Review of the MIP images confirms the above findings. NON-VASCULAR Hepatobiliary: Cholecystectomy. Unremarkable liver and biliary tree. Pancreas: Unremarkable. Spleen: Unremarkable. Adrenals/Urinary Tract: Normal adrenal glands. No urinary calculi or hydronephrosis. Unremarkable bladder. Stomach/Bowel: Normal caliber large and small bowel. Colonic diverticulosis without diverticulitis. Lymphatic: No lymphadenopathy. Reproductive: Prostatic enlargement with brachytherapy beads. Other: No free intraperitoneal fluid or air. Musculoskeletal: No acute or significant osseous findings. Review of the MIP images confirms the above findings. IMPRESSION: 1. Infrarenal abdominal aortic aneurysm measuring up to 3.3 cm. Recommend follow-up ultrasound every 3 years. This recommendation follows ACR consensus guidelines: White Paper of the ACR Incidental Findings Committee II on Vascular Findings. J Am Coll Radiol 2013; 10:789-794. 2. 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 3. No acute findings in the chest, abdomen, or pelvis. 4.  Colonic diverticulosis without diverticulitis. Electronically Signed   By: Minerva Fester M.D.   On: 10/21/2022 22:55   DG Chest 2 View  Result Date: 10/21/2022 CLINICAL DATA:  Left-sided chest pain and left arm pain. EXAM: CHEST - 2 VIEW COMPARISON:  December 18, 2021 FINDINGS: The heart size and mediastinal contours are within normal limits. Very mild atelectasis is noted within the left lung base. There is no evidence of acute infiltrate, pleural effusion or pneumothorax. The visualized skeletal  structures are unremarkable. IMPRESSION: No active cardiopulmonary disease. Electronically Signed   By: Aram Candela M.D.   On: 10/21/2022 19:31    Procedures Procedures    Medications Ordered in ED Medications  aspirin chewable tablet 243 mg (243 mg Oral Given 10/21/22 2122)  iohexol (OMNIPAQUE) 350 MG/ML injection 100 mL (100 mLs Intravenous Contrast Given 10/21/22 2230)    ED Course/ Medical Decision Making/ A&P                             Medical Decision Making Patient with history of cardiac disease, substantial, with multiple prior stents, as well as hypertension, less elevated cardiac risk profile presents with chest pain.  Given his description of pain rating from front to back, dissection is consideration, and D-dimer was added to initial labs.  Initial troponin normal, D-dimer elevated, second troponin delta of 4, and I discussed this case with his cardiologist who will see the patient in the morning as a consulting service. Given his elevated D-dimer, anterior/posterior pain, dissection study was performed and this was generally reassurin given his ongoing pain, delta troponin of 4, history of cardiac disease with a heart score of 7    Amount and/or Complexity of Data Reviewed Independent Historian: spouse External Data Reviewed: notes. Labs: ordered. Decision-making details documented in ED Course. Radiology: ordered and independent interpretation performed. Decision-making details documented in ED Course. ECG/medicine tests: ordered and independent interpretation performed. Decision-making details documented in ED Course.  Risk OTC drugs. Prescription drug management. Decision regarding hospitalization.  Final Clinical Impression(s) / ED Diagnoses Final diagnoses:  Atypical chest pain     Gerhard Munch, MD 10/21/22 2334

## 2022-10-21 NOTE — ED Notes (Signed)
CN notified pt in need of next available room

## 2022-10-22 ENCOUNTER — Encounter (HOSPITAL_COMMUNITY): Payer: Self-pay | Admitting: Family Medicine

## 2022-10-22 ENCOUNTER — Observation Stay (HOSPITAL_BASED_OUTPATIENT_CLINIC_OR_DEPARTMENT_OTHER): Payer: Medicare Other

## 2022-10-22 ENCOUNTER — Other Ambulatory Visit: Payer: Self-pay

## 2022-10-22 DIAGNOSIS — R072 Precordial pain: Secondary | ICD-10-CM

## 2022-10-22 DIAGNOSIS — I7143 Infrarenal abdominal aortic aneurysm, without rupture: Secondary | ICD-10-CM | POA: Diagnosis present

## 2022-10-22 DIAGNOSIS — I2510 Atherosclerotic heart disease of native coronary artery without angina pectoris: Secondary | ICD-10-CM | POA: Diagnosis not present

## 2022-10-22 DIAGNOSIS — E876 Hypokalemia: Secondary | ICD-10-CM | POA: Diagnosis present

## 2022-10-22 LAB — BASIC METABOLIC PANEL
Anion gap: 10 (ref 5–15)
BUN: 10 mg/dL (ref 8–23)
CO2: 21 mmol/L — ABNORMAL LOW (ref 22–32)
Calcium: 9 mg/dL (ref 8.9–10.3)
Chloride: 107 mmol/L (ref 98–111)
Creatinine, Ser: 1.1 mg/dL (ref 0.61–1.24)
GFR, Estimated: >60 mL/min (ref >60)
Glucose, Bld: 106 mg/dL — ABNORMAL HIGH (ref 70–99)
Potassium: 3.3 mmol/L — ABNORMAL LOW (ref 3.5–5.1)
Sodium: 138 mmol/L (ref 135–145)

## 2022-10-22 LAB — CBC
HCT: 48.4 % (ref 39.0–52.0)
Hemoglobin: 16 g/dL (ref 13.0–17.0)
MCH: 28.9 pg (ref 26.0–34.0)
MCHC: 33.1 g/dL (ref 30.0–36.0)
MCV: 87.5 fL (ref 80.0–100.0)
Platelets: 124 10*3/uL — ABNORMAL LOW (ref 150–400)
RBC: 5.53 MIL/uL (ref 4.22–5.81)
RDW: 13.2 % (ref 11.5–15.5)
WBC: 8.2 10*3/uL (ref 4.0–10.5)
nRBC: 0 % (ref 0.0–0.2)

## 2022-10-22 LAB — MAGNESIUM: Magnesium: 1.9 mg/dL (ref 1.7–2.4)

## 2022-10-22 LAB — GLUCOSE, CAPILLARY
Glucose-Capillary: 97 mg/dL (ref 70–99)
Glucose-Capillary: 144 mg/dL — ABNORMAL HIGH (ref 70–99)
Glucose-Capillary: 141 mg/dL — ABNORMAL HIGH (ref 70–99)

## 2022-10-22 LAB — ECHOCARDIOGRAM COMPLETE
AR max vel: 2.05 cm2
AV Area VTI: 1.97 cm2
AV Area mean vel: 2.09 cm2
AV Mean grad: 5 mmHg
AV Peak grad: 9.2 mmHg
Ao pk vel: 1.52 m/s
Area-P 1/2: 2.67 cm2
Height: 68 in
S' Lateral: 2.4 cm
Weight: 3328 oz

## 2022-10-22 LAB — CBG MONITORING, ED
Glucose-Capillary: 105 mg/dL — ABNORMAL HIGH (ref 70–99)
Glucose-Capillary: 116 mg/dL — ABNORMAL HIGH (ref 70–99)

## 2022-10-22 LAB — HEMOGLOBIN A1C
Hgb A1c MFr Bld: 7 % — ABNORMAL HIGH (ref 4.8–5.6)
Mean Plasma Glucose: 154.2 mg/dL

## 2022-10-22 MED ORDER — INSULIN ASPART 100 UNIT/ML IJ SOLN: 0.0000 [IU] | INTRAMUSCULAR | Status: DC

## 2022-10-22 MED ORDER — POTASSIUM CHLORIDE CRYS ER 20 MEQ PO TBCR
20.0000 meq | EXTENDED_RELEASE_TABLET | Freq: Once | ORAL | Status: AC
  Administered 2022-10-22: 20 meq via ORAL
  Filled 2022-10-22: qty 1

## 2022-10-22 MED ORDER — RAMIPRIL 1.25 MG PO CAPS
2.5000 mg | ORAL_CAPSULE | Freq: Every day | ORAL | Status: DC
  Administered 2022-10-22 – 2022-10-23 (×2): 2.5 mg via ORAL
  Filled 2022-10-22 (×2): qty 2

## 2022-10-22 MED ORDER — PANTOPRAZOLE SODIUM 40 MG PO TBEC
40.0000 mg | DELAYED_RELEASE_TABLET | Freq: Every day | ORAL | Status: DC
  Administered 2022-10-22 – 2022-10-23 (×2): 40 mg via ORAL
  Filled 2022-10-22 (×2): qty 1

## 2022-10-22 MED ORDER — ROSUVASTATIN CALCIUM 20 MG PO TABS
40.0000 mg | ORAL_TABLET | Freq: Every day | ORAL | Status: DC
  Administered 2022-10-22 – 2022-10-23 (×2): 40 mg via ORAL
  Filled 2022-10-22 (×2): qty 2

## 2022-10-22 MED ORDER — ACETAMINOPHEN 325 MG PO TABS: 650.0000 mg | ORAL_TABLET | ORAL | Status: DC | PRN

## 2022-10-22 MED ORDER — NITROGLYCERIN 2 % TD OINT
0.5000 [in_us] | TOPICAL_OINTMENT | Freq: Four times a day (QID) | TRANSDERMAL | Status: DC
  Administered 2022-10-22 – 2022-10-23 (×3): 0.5 [in_us] via TOPICAL
  Filled 2022-10-22 (×5): qty 1

## 2022-10-22 MED ORDER — ENOXAPARIN SODIUM 40 MG/0.4ML IJ SOSY
40.0000 mg | PREFILLED_SYRINGE | INTRAMUSCULAR | Status: DC
  Administered 2022-10-22: 40 mg via SUBCUTANEOUS
  Filled 2022-10-22 (×2): qty 0.4

## 2022-10-22 MED ORDER — ONDANSETRON HCL 4 MG/2ML IJ SOLN: 4.0000 mg | Freq: Four times a day (QID) | INTRAMUSCULAR | Status: DC | PRN

## 2022-10-22 MED ORDER — ASPIRIN 81 MG PO TBEC
81.0000 mg | DELAYED_RELEASE_TABLET | Freq: Every day | ORAL | Status: DC
  Administered 2022-10-22 – 2022-10-23 (×2): 81 mg via ORAL
  Filled 2022-10-22 (×2): qty 1

## 2022-10-22 MED ORDER — METOPROLOL SUCCINATE ER 50 MG PO TB24
50.0000 mg | ORAL_TABLET | Freq: Every day | ORAL | Status: DC
  Administered 2022-10-22 – 2022-10-23 (×2): 50 mg via ORAL
  Filled 2022-10-22: qty 2
  Filled 2022-10-22: qty 1

## 2022-10-22 MED ORDER — ROSUVASTATIN CALCIUM 20 MG PO TABS: 20.0000 mg | ORAL_TABLET | Freq: Every day | ORAL | Status: DC

## 2022-10-22 NOTE — ED Notes (Signed)
Admitting provider at bedside.

## 2022-10-22 NOTE — Consult Note (Signed)
Reason for Consult: Chest pain Referring Physician: [facility] hospital[facility]  Jackson Porter is an 72 y.o. male.  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  EKG done in the ED showed normal sinus rhythm with right bundle branch block with secondary ST-T wave changes old inferior wall MI poor R wave progression in anterolateral leads as before no new acute ischemic changes are noted results of high-sensitivity troponin has been negative.  Patient also had CT of the chest which showed no evidence of dissection but small abdominal aortic aneurysm and severe stenosis of bilateral common iliac arteries.  Patient does give history of claudication pain in the hips occasionally but states his activity is limited.  Denies any claudication pain in the calves.  Presently denies any chest pain.  Past Medical History:  Diagnosis Date   Coronary artery disease    Diabetes mellitus without complication (HCC)    Myocardial infarction [facility])     Past Surgical History:  Procedure Laterality Date   APPENDECTOMY     CHOLECYSTECTOMY N/A 01/02/2022   Procedure: LAPAROSCOPIC CHOLECYSTECTOMY;  Surgeon: [name], MD;  Location: [facility];  Service: General;  Laterality: N/A;   CORONARY STENT PLACEMENT     x3   HERNIA REPAIR Right    TONSILLECTOMY      History reviewed. No pertinent family history.  Social History:  reports  that he quit smoking about 27 years ago. His smoking use included cigarettes. He has a 45.00 pack-year smoking history. He has never used smokeless tobacco. He reports that he does not drink alcohol and does not use drugs.  Allergies:  Allergies  Allergen Reactions   Ibuprofen Other (See Comments)    Hives in high dosage    Medications: I have reviewed the patient's current medications.  Results for orders placed or performed during the hospital encounter of 10/21/22 (from the past 48 hour(s))  Basic metabolic panel     Status: Abnormal   Collection Time: 10/21/22  6:22 PM  Result Value Ref Range   Sodium 139 135 - 145 mmol/L   Potassium 3.3 (L) 3.5 - 5.1 mmol/L   Chloride 106 98 - 111 mmol/L   CO2 20 (L) 22 - 32 mmol/L   Glucose, Bld 185 (H) 70 - 99 mg/dL    Comment: Glucose reference range applies only to samples taken after fasting for at least 8 hours.   BUN 12 8 - 23 mg/dL   Creatinine, Ser 1.47 (H) 0.61 - 1.24 mg/dL   Calcium 9.2 8.9 - 82.9 mg/dL   GFR, Estimated >56 >21 mL/min    Comment: (NOTE) Calculated using the CKD-EPI Creatinine Equation (2021)    Anion gap 13 5 - 15    Comment: Performed at [facility], [address] [place], [place] [address]  CBC     Status: Abnormal   Collection Time: 10/21/22  6:22 PM  Result Value Ref Range  WBC 12.0 (H) 4.0 - 10.5 K/uL   RBC 5.71 4.22 - 5.81 MIL/uL   Hemoglobin 16.7 13.0 - 17.0 g/dL   HCT 16.1 09.6 - 04.5 %   MCV 86.2 80.0 - 100.0 fL   MCH 29.2 26.0 - 34.0 pg   MCHC 33.9 30.0 - 36.0 g/dL   RDW 40.9 81.1 - 91.4 %   Platelets 157 150 - 400 K/uL   nRBC 0.0 0.0 - 0.2 %    Comment: Performed at [facility], [address] [place], [place] [address]  Troponin I (High Sensitivity)     Status: None   Collection Time: 10/21/22  6:22 PM  Result Value Ref Range   Troponin I (High Sensitivity) 11 <18 ng/L    Comment: (NOTE) Elevated high sensitivity troponin I (hsTnI) values and significant  changes across  serial measurements may suggest ACS but many other  chronic and acute conditions are known to elevate hsTnI results.  Refer to the "Links" section for chest pain algorithms and additional  guidance. Performed at [facility], [address], [place], [place] [address]   D-dimer, quantitative     Status: Abnormal   Collection Time: 10/21/22  6:22 PM  Result Value Ref Range   D-Dimer, Quant 0.80 (H) 0.00 - 0.50 ug/mL-FEU    Comment: (NOTE) At the manufacturer cut-off value of 0.5 g/mL FEU, this assay has a negative predictive value of 95-100%.This assay is intended for use in conjunction with a clinical pretest probability (PTP) assessment model to exclude pulmonary embolism (PE) and deep venous thrombosis (DVT) in outpatients suspected of PE or DVT. Results should be correlated with clinical presentation. Performed at [facility], [address] [place], [place] [address]   Troponin I (High Sensitivity)     Status: None   Collection Time: 10/21/22  9:12 PM  Result Value Ref Range   Troponin I (High Sensitivity) 15 <18 ng/L    Comment: (NOTE) Elevated high sensitivity troponin I (hsTnI) values and significant  changes across serial measurements may suggest ACS but many other  chronic and acute conditions are known to elevate hsTnI results.  Refer to the "Links" section for chest pain algorithms and additional  guidance. Performed at [facility], [address] [place], [place] [address]   Hemoglobin A1c     Status: Abnormal   Collection Time: 10/22/22  1:39 AM  Result Value Ref Range   Hgb A1c MFr Bld 7.0 (H) 4.8 - 5.6 %    Comment: (NOTE) Pre diabetes:          5.7%-6.4%  Diabetes:              >6.4%  Glycemic control for   <7.0% adults with diabetes    Mean Plasma Glucose 154.2 mg/dL    Comment: Performed at [facility], [address] [place], [place] [address]  Basic metabolic panel     Status: Abnormal   Collection Time: 10/22/22   1:39 AM  Result Value Ref Range   Sodium 138 135 - 145 mmol/L   Potassium 3.3 (L) 3.5 - 5.1 mmol/L   Chloride 107 98 - 111 mmol/L   CO2 21 (L) 22 - 32 mmol/L   Glucose, Bld 106 (H) 70 - 99 mg/dL    Comment: Glucose reference range applies only to samples taken after fasting for at least 8 hours.   BUN 10 8 - 23 mg/dL   Creatinine, Ser  1.10 0.61 - 1.24 mg/dL   Calcium 9.0 8.9 - 40.9 mg/dL   GFR, Estimated >81 >19 mL/min    Comment: (NOTE) Calculated using the CKD-EPI Creatinine Equation (2021)    Anion gap 10 5 - 15    Comment: Performed at [facility], [address], [place], [place] [address]  Magnesium     Status: None   Collection Time: 10/22/22  1:39 AM  Result Value Ref Range   Magnesium 1.9 1.7 - 2.4 mg/dL    Comment: Performed at [facility], [address], [place], [place] [address]  CBC     Status: Abnormal   Collection Time: 10/22/22  1:39 AM  Result Value Ref Range   WBC 8.2 4.0 - 10.5 K/uL   RBC 5.53 4.22 - 5.81 MIL/uL   Hemoglobin 16.0 13.0 - 17.0 g/dL   HCT 30.8 65.7 - 84.6 %   MCV 87.5 80.0 - 100.0 fL   MCH 28.9 26.0 - 34.0 pg   MCHC 33.1 30.0 - 36.0 g/dL   RDW 96.2 95.2 - 84.1 %   Platelets 124 (L) 150 - 400 K/uL    Comment: REPEATED TO VERIFY   nRBC 0.0 0.0 - 0.2 %    Comment: Performed at [facility], [address], [place], [place] [address]  CBG monitoring, ED     Status: Abnormal   Collection Time: 10/22/22  3:51 AM  Result Value Ref Range   Glucose-Capillary 105 (H) 70 - 99 mg/dL    Comment: Glucose reference range applies only to samples taken after fasting for at least 8 hours.    CT Angio Chest/Abd/Pel for Dissection W and/or Wo Contrast  Result Date: 10/21/2022 CLINICAL DATA:  Dizziness, throat tightness, chest pain EXAM: CT ANGIOGRAPHY CHEST, ABDOMEN AND PELVIS TECHNIQUE: Non-contrast CT of the chest was initially obtained. Multidetector CT imaging through the chest, abdomen and pelvis was performed using the standard  protocol during bolus administration of intravenous contrast. Multiplanar reconstructed images and MIPs were obtained and reviewed to evaluate the vascular anatomy. RADIATION DOSE REDUCTION: This exam was performed according to the departmental dose-optimization program which includes automated exposure control, adjustment of the mA and/or kV according to patient size and/or use of iterative reconstruction technique. CONTRAST:  OMNIPAQUE IOHEXOL 350 MG/ML SOLN COMPARISON:  Chest radiographs 10/21/2022 and CTA chest abdomen and pelvis 12/18/2021 FINDINGS: CTA CHEST FINDINGS Cardiovascular: Coronary artery and aortic atherosclerotic calcification. No aortic aneurysm, dissection, or intramural hematoma. No pericardial effusion. Mediastinum/Nodes: Unremarkable trachea and esophagus. No thoracic adenopathy. Lungs/Pleura: Normal variant azygous lobe. No focal consolidation, pleural effusion, or pneumothorax. Musculoskeletal: No acute fracture. Review of the MIP images confirms the above findings. CTA ABDOMEN AND PELVIS FINDINGS VASCULAR Aorta: 3.3 cm infrarenal abdominal aorta or aortic aneurysm with eccentric thrombus. No dissection. Calcified atherosclerotic plaque. No significant stenosis. Celiac: Patent without evidence of aneurysm, dissection, vasculitis or significant stenosis. SMA: Patent without evidence of aneurysm, dissection, vasculitis or significant stenosis. Renals: Both renal arteries are patent without evidence of aneurysm, dissection, vasculitis, fibromuscular dysplasia or significant stenosis. IMA: Patent. Inflow: Calcified atherosclerotic plaque with multifocal areas of narrowing greatest at the origin of the right common iliac artery and mid left common iliac artery where it is severe no severe. No aneurysm or dissection Veins: No obvious venous abnormality within the limitations of this arterial phase study. Review of the MIP images confirms the above findings. NON-VASCULAR Hepatobiliary:  Cholecystectomy. Unremarkable liver and biliary tree. Pancreas: Unremarkable.  Spleen: Unremarkable. Adrenals/Urinary Tract: Normal adrenal glands. No urinary calculi or hydronephrosis. Unremarkable bladder. Stomach/Bowel: Normal caliber large and small bowel. Colonic diverticulosis without diverticulitis. Lymphatic: No lymphadenopathy. Reproductive: Prostatic enlargement with brachytherapy beads. Other: No free intraperitoneal fluid or air. Musculoskeletal: No acute or significant osseous findings. Review of the MIP images confirms the above findings. IMPRESSION: 1. Infrarenal abdominal aortic aneurysm measuring up to 3.3 cm. Recommend follow-up ultrasound every 3 years. This recommendation follows ACR consensus guidelines: White Paper of the ACR Incidental Findings Committee II on Vascular Findings. J Am Coll Radiol 2013; 10:789-794. 2. 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 3. No acute findings in the chest, abdomen, or pelvis. 4. Colonic diverticulosis without diverticulitis. Electronically Signed   By: Minerva Fester M.D.   On: 10/21/2022 22:55   DG Chest 2 View  Result Date: 10/21/2022 CLINICAL DATA:  Left-sided chest pain and left arm pain. EXAM: CHEST - 2 VIEW COMPARISON:  December 18, 2021 FINDINGS: The heart size and mediastinal contours are within normal limits. Very mild atelectasis is noted within the left lung base. There is no evidence of acute infiltrate, pleural effusion or pneumothorax. The visualized skeletal structures are unremarkable. IMPRESSION: No active cardiopulmonary disease. Electronically Signed   By: Aram Candela M.D.   On: 10/21/2022 19:31    Review of Systems  Constitutional:  Negative for diaphoresis and fever.  HENT:  Negative for sore throat.   Eyes:  Negative for discharge.  Respiratory:  Positive for shortness of breath.   Cardiovascular:  Positive for chest pain. Negative for palpitations and leg swelling.   Gastrointestinal:  Negative for abdominal distention.  Genitourinary:  Negative for difficulty urinating.  Neurological:  Negative for dizziness and seizures.   Blood pressure (!) 140/106, pulse (!) 111, temperature 98 F (36.7 C), resp. rate (!) 22, height 5\' 8"  (1.727 m), weight 94.3 kg, SpO2 96 %. Physical Exam Constitutional:      Appearance: He is well-developed.  HENT:     Head: Normocephalic and atraumatic.  Eyes:     Extraocular Movements: Extraocular movements intact.     Pupils: Pupils are equal, round, and reactive to light.  Neck:     Vascular: No JVD.  Cardiovascular:     Rate and Rhythm: Normal rate and regular rhythm.     Comments: Soft systolic murmur noted no S3 gallop Pulmonary:     Effort: Pulmonary effort is normal.     Breath sounds: Normal breath sounds. No wheezing, rhonchi or rales.  Abdominal:     General: Bowel sounds are normal.     Palpations: Abdomen is soft. There is no hepatomegaly.  Musculoskeletal:     Cervical back: Normal range of motion and neck supple.     Right lower leg: No tenderness. No edema.     Left lower leg: No tenderness. No edema.  Neurological:     General: No focal deficit present.     Mental Status: He is alert and oriented to person, place, and time.     Assessment/Plan: New onset angina MI ruled out rule out progression of CAD Multivessel CAD history of MI in the past status post multivessel PCI in 2007 Hypertension Type 2 diabetes mellitus Hyperlipidemia Remote tobacco abuse Peripheral vascular disease with bilateral severe stenosis of the common iliac arteries. Small abdominal aortic aneurysm Plan Agree with present management Will add Nitropaste as per orders Increase Crestor to 40 mg daily Check  2D echo Discussed with patient at length various options of treatment i.e. noninvasive stress testing versus invasive left cardiac catheterization possible PTCA stenting and also peripheral angiogram as patient has  significant common iliac disease and multivessel PCI in the past, patient eager to go home actually but agreed for noninvasive stress testing and if negative would like to follow-up for peripheral vascular disease as outpatient. Will get interventional cardiology consult as appropriate Rinaldo Cloud 10/22/2022, 7:39 AM

## 2022-10-22 NOTE — H&P (Signed)
History and Physical    [name]:[id number] DOB: April 08, 1951 DOA: 10/21/2022  PCP: Irven Coe, MD   Patient coming from: Home   Chief Complaint: Chest pain   HPI: Jackson Porter is a pleasant 72 y.o. male with medical history significant for type 2 diabetes mellitus and CAD, now presenting to the emergency department with dizziness, throat tightness, chest pain, and headache.  Patient reports that he was feeling generally poor yesterday afternoon with generalized weakness and posterior headache.  He also developed discomfort in his upper chest with radiation to his neck.  This was associated with mild shortness of breath and mild nausea.  He was doing some electrical work on his truck at the time of onset and states that this was not strenuous work at all.  He reports that the symptoms are very similar to what he was experiencing when he had an MI previously.  He denies any swelling, cough, or fever.  ED Course: Upon arrival to the ED, patient is found to be afebrile and saturating well on room air with slightly elevated heart rate and stable blood pressure.  EKG demonstrates sinus tachycardia with rate 110, LAD, RBBB, and QTc 503.  Chest x-ray is negative for acute cardiopulmonary disease.  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  Labs are most notable for potassium 3.3, creatinine 1.26, WBC 12,000, and troponin of 11 and then 15.  Cardiology (Dr. Sharyn Lull) was consulted by the ED physician and the patient was given 243 mg of aspirin in the ED.  Review of Systems:  All other systems reviewed and apart from HPI, are negative.  Past Medical History:  Diagnosis Date   Coronary artery disease    Diabetes mellitus without complication (HCC)    Myocardial infarction [facility])     Past Surgical History:  Procedure Laterality Date   APPENDECTOMY     CHOLECYSTECTOMY N/A 01/02/2022    Procedure: LAPAROSCOPIC CHOLECYSTECTOMY;  Surgeon: [name], MD;  Location: [facility];  Service: General;  Laterality: N/A;   CORONARY STENT PLACEMENT     x3   HERNIA REPAIR Right    TONSILLECTOMY      Social History:   reports that he quit smoking about 27 years ago. His smoking use included cigarettes. He has a 45.00 pack-year smoking history. He has never used smokeless tobacco. He reports that he does not drink alcohol and does not use drugs.  Allergies  Allergen Reactions   Ibuprofen Other (See Comments)    Hives in high dosage    History reviewed. No pertinent family history.   Prior to Admission medications   Medication Sig Start Date End Date Taking? Authorizing Provider  aspirin EC 81 MG tablet Take 81 mg by mouth daily. Swallow whole.    [provider]  glimepiride (AMARYL) 4 MG tablet  07/18/21   [provider]  ibuprofen (ADVIL) 200 MG tablet Take 200 mg by mouth every 6 (six) hours as needed.    [provider]  JARDIANCE 10 MG TABS tablet  01/24/19   [provider]  metoprolol succinate (TOPROL-XL) 50 MG 24 hr tablet  01/21/19   [provider]  oxyCODONE-acetaminophen (PERCOCET) 5-325 MG tablet Take 1 tablet by mouth every 4 (four) hours as needed for severe pain. 01/02/22 01/02/23  [name], MD  pantoprazole (PROTONIX) 40 MG tablet Take 1 tablet by mouth daily.    [provider]  ramipril (ALTACE) 2.5 MG capsule Take 2.5 mg by mouth daily.    [provider]  rosuvastatin (CRESTOR) 20 MG tablet  11/18/18   [provider]  Specialty Vitamins Products (PROSTATE PO) Take by mouth.    [provider]  sucralfate (CARAFATE) 1 g tablet Take 1 tablet (1 g total) by mouth 4 (four) times daily -  with meals and at bedtime. 12/18/21   Mancel Bale, MD  VITAMIN D PO Take by mouth.    [provider]    Physical Exam: Vitals:   10/21/22 1815 10/21/22 1831 10/21/22 2115  BP: 139/69   (!) 152/85  Pulse: (!) 108    Resp: 18  17  Temp: 98.7 F (37.1 C)    SpO2: 96%    Weight:  94.3 kg   Height:  5\' 8"  (1.727 m)     Constitutional: NAD, calm  Eyes: PERTLA, lids and conjunctivae normal ENMT: Mucous membranes are moist. Posterior pharynx clear of any exudate or lesions.   Neck: supple, no masses  Respiratory: no wheezing, no crackles. No accessory muscle use.  Cardiovascular: S1 & S2 heard, regular rate and rhythm. No extremity edema.  Abdomen: No distension, no tenderness, soft. Bowel sounds active.  Musculoskeletal: no clubbing / cyanosis. No joint deformity upper and lower extremities.   Skin: no significant rashes, lesions, ulcers. Warm, dry, well-perfused. Neurologic: CN 2-12 grossly intact. Moving all extremities. Alert and oriented.  Psychiatric: Pleasant. Cooperative.    Labs and Imaging on Admission: I have personally reviewed following labs and imaging studies  CBC: Recent Labs  Lab 10/21/22 1822  WBC 12.0*  HGB 16.7  HCT 49.2  MCV 86.2  PLT 157   Basic Metabolic Panel: Recent Labs  Lab 10/21/22 1822  NA 139  K 3.3*  CL 106  CO2 20*  GLUCOSE 185*  BUN 12  CREATININE 1.26*  CALCIUM 9.2   GFR: Estimated Creatinine Clearance: 59.9 mL/min (A) (by C-G formula based on SCr of 1.26 mg/dL (H)). Liver Function Tests: No results for input(s): "AST", "ALT", "ALKPHOS", "BILITOT", "PROT", "ALBUMIN" in the last 168 hours. No results for input(s): "LIPASE", "AMYLASE" in the last 168 hours. No results for input(s): "AMMONIA" in the last 168 hours. Coagulation Profile: No results for input(s): "INR", "PROTIME" in the last 168 hours. Cardiac Enzymes: No results for input(s): "CKTOTAL", "CKMB", "CKMBINDEX", "TROPONINI" in the last 168 hours. BNP (last 3 results) No results for input(s): "PROBNP" in the last 8760 hours. HbA1C: No results for input(s): "HGBA1C" in the last 72 hours. CBG: No results for input(s): "GLUCAP" in the last 168 hours. Lipid  Profile: No results for input(s): "CHOL", "HDL", "LDLCALC", "TRIG", "CHOLHDL", "LDLDIRECT" in the last 72 hours. Thyroid Function Tests: No results for input(s): "TSH", "T4TOTAL", "FREET4", "T3FREE", "THYROIDAB" in the last 72 hours. Anemia Panel: No results for input(s): "VITAMINB12", "FOLATE", "FERRITIN", "TIBC", "IRON", "RETICCTPCT" in the last 72 hours. Urine analysis: No results found for: "COLORURINE", "APPEARANCEUR", "LABSPEC", "PHURINE", "GLUCOSEU", "HGBUR", "BILIRUBINUR", "KETONESUR", "PROTEINUR", "UROBILINOGEN", "NITRITE", "LEUKOCYTESUR" Sepsis Labs: @LABRCNTIP (procalcitonin:4,lacticidven:4) )No results found for this or any previous visit (from the past 240 hour(s)).   Radiological Exams on Admission: CT Angio Chest/Abd/Pel for Dissection W and/or Wo Contrast  Result Date: 10/21/2022 CLINICAL DATA:  Dizziness, throat tightness, chest pain EXAM: CT ANGIOGRAPHY CHEST, ABDOMEN AND PELVIS TECHNIQUE: Non-contrast CT of the chest was initially obtained. Multidetector CT imaging through the chest, abdomen and pelvis was performed using the standard protocol during bolus administration  of intravenous contrast. Multiplanar reconstructed images and MIPs were obtained and reviewed to evaluate the vascular anatomy. RADIATION DOSE REDUCTION: This exam was performed according to the departmental dose-optimization program which includes automated exposure control, adjustment of the mA and/or kV according to patient size and/or use of iterative reconstruction technique. CONTRAST:  OMNIPAQUE IOHEXOL 350 MG/ML SOLN COMPARISON:  Chest radiographs 10/21/2022 and CTA chest abdomen and pelvis 12/18/2021 FINDINGS: CTA CHEST FINDINGS Cardiovascular: Coronary artery and aortic atherosclerotic calcification. No aortic aneurysm, dissection, or intramural hematoma. No pericardial effusion. Mediastinum/Nodes: Unremarkable trachea and esophagus. No thoracic adenopathy. Lungs/Pleura: Normal variant azygous lobe. No  focal consolidation, pleural effusion, or pneumothorax. Musculoskeletal: No acute fracture. Review of the MIP images confirms the above findings. CTA ABDOMEN AND PELVIS FINDINGS VASCULAR Aorta: 3.3 cm infrarenal abdominal aorta or aortic aneurysm with eccentric thrombus. No dissection. Calcified atherosclerotic plaque. No significant stenosis. Celiac: Patent without evidence of aneurysm, dissection, vasculitis or significant stenosis. SMA: Patent without evidence of aneurysm, dissection, vasculitis or significant stenosis. Renals: Both renal arteries are patent without evidence of aneurysm, dissection, vasculitis, fibromuscular dysplasia or significant stenosis. IMA: Patent. Inflow: Calcified atherosclerotic plaque with multifocal areas of narrowing greatest at the origin of the right common iliac artery and mid left common iliac artery where it is severe no severe. No aneurysm or dissection Veins: No obvious venous abnormality within the limitations of this arterial phase study. Review of the MIP images confirms the above findings. NON-VASCULAR Hepatobiliary: Cholecystectomy. Unremarkable liver and biliary tree. Pancreas: Unremarkable. Spleen: Unremarkable. Adrenals/Urinary Tract: Normal adrenal glands. No urinary calculi or hydronephrosis. Unremarkable bladder. Stomach/Bowel: Normal caliber large and small bowel. Colonic diverticulosis without diverticulitis. Lymphatic: No lymphadenopathy. Reproductive: Prostatic enlargement with brachytherapy beads. Other: No free intraperitoneal fluid or air. Musculoskeletal: No acute or significant osseous findings. Review of the MIP images confirms the above findings. IMPRESSION: 1. Infrarenal abdominal aortic aneurysm measuring up to 3.3 cm. Recommend follow-up ultrasound every 3 years. This recommendation follows ACR consensus guidelines: White Paper of the ACR Incidental Findings Committee II on Vascular Findings. J Am Coll Radiol 2013; 10:789-794. 2. 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 3. No acute findings in the chest, abdomen, or pelvis. 4. Colonic diverticulosis without diverticulitis. Electronically Signed   By: Minerva Fester M.D.   On: 10/21/2022 22:55   DG Chest 2 View  Result Date: 10/21/2022 CLINICAL DATA:  Left-sided chest pain and left arm pain. EXAM: CHEST - 2 VIEW COMPARISON:  December 18, 2021 FINDINGS: The heart size and mediastinal contours are within normal limits. Very mild atelectasis is noted within the left lung base. There is no evidence of acute infiltrate, pleural effusion or pneumothorax. The visualized skeletal structures are unremarkable. IMPRESSION: No active cardiopulmonary disease. Electronically Signed   By: Aram Candela M.D.   On: 10/21/2022 19:31    EKG: Independently reviewed. Sinus tachycardia, rate 110, LAD, RBBB, QTc 503.   Assessment/Plan   1. Chest pain; CAD  - HS troponin normal x2 in ED, no acute findings on CTA chest/abd/pelvis   - Treated with ASA in ED  - Continue cardiac monitoring, continue aspirin, Crestor, metoprolol, and ramipril, follow-up on cardiology recommendations    2. Type II DM  - A1c was 7.9% in February 2024 - Check CBGs and use low-intensity SSI for now    3. Hypokalemia  - Replace potassium, repeat chem panel   4. AAA  - 3.3 cm infrarenal AAA  noted on CT in ED  - Outpatient follow-up recommended     DVT prophylaxis: Lovenox  Code Status: Full  Level of Care: Level of care: Telemetry Cardiac Family Communication: Wife updated from ED  Disposition Plan:  Patient is from: Home  Anticipated d/c is to: Home Anticipated d/c date is: 6/9 or 10/23/22  Patient currently: Pending cardiology consultation  Consults called: Cardiology  Admission status: Observation     Briscoe Deutscher, MD [facility]  10/22/2022, 12:13 AM

## 2022-10-22 NOTE — Progress Notes (Signed)
PROGRESS NOTE    Jackson Porter [facility]  ZOX:[id number] DOB: 1951/04/21 DOA: 10/21/2022 PCP: Irven Coe, MD  Outpatient Specialists:     Brief Narrative:  Patient is a 72 year old male past medical history significant for type 2 diabetes mellitus and coronary artery disease status post PCI, hypertension and hyperlipidemia.  Patient is admitted with chest pain.  Patient is well-known to the cardiology team.  Cardiac stress test is planned for in the morning.  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  Cardiology team is directing care.  PVD may be further assessed on outpatient basis according to the cardiology team.  10/22/2022: Patient seen alongside patient's wife and nurse.  No chest pain reported.  Awaiting cardiac stress test in the morning.   Assessment & Plan:   Principal Problem:   Chest pain Active Problems:   Type 2 diabetes mellitus (HCC)   Hypokalemia   Infrarenal abdominal aortic aneurysm (AAA) without rupture (HCC)   1. Chest pain; CAD  -Negative troponin. -Cardiology is directing care. -For cardiac stress test in the morning. -Further management depend on above. -Continue current regimen.    2. Type II DM  - A1c 7%. -Continue to optimize blood sugar control.      3. Hypokalemia  -Continue to monitor and replace.      4. 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 -Further management will depend on hospital course.   DVT prophylaxis: Subcutaneous Lovenox. Code Status: Full code. Family Communication: Wife. Disposition Plan: Possible discharge in the morning.   Consultants:  Cardiology team.  Procedures:  Stress test is planned.  Antimicrobials:  None.   Subjective: No chest pain.  Objective: Vitals:   10/22/22 0900 10/22/22 0930 10/22/22  1147 10/22/22 1623  BP: 138/80 130/79 124/80   Pulse: 77 73    Resp: (!) 21 20  16   Temp:   98.2 F (36.8 C) 98.2 F (36.8 C)  TempSrc:   Oral Oral  SpO2: 93% 94% 94%   Weight:      Height:       No intake or output data in the 24 hours ending 10/22/22 1852 Filed Weights   10/21/22 1831  Weight: 94.3 kg    Examination:  General exam: Appears calm and comfortable  Respiratory system: Clear to auscultation.  Cardiovascular system: S1 & S2 heard Gastrointestinal system: Abdomen is soft and nontender.   Central nervous system: Alert and oriented.  Patient moves all extremities. Extremities: No leg edema.  Data Reviewed: I have personally reviewed following labs and imaging studies  CBC: Recent Labs  Lab 10/21/22 1822 10/22/22 0139  WBC 12.0* 8.2  HGB 16.7 16.0  HCT 49.2 48.4  MCV 86.2 87.5  PLT 157 124*   Basic Metabolic Panel: Recent Labs  Lab 10/21/22 1822 10/22/22 0139  NA 139 138  K 3.3* 3.3*  CL 106 107  CO2 20* 21*  GLUCOSE 185* 106*  BUN 12 10  CREATININE 1.26* 1.10  CALCIUM 9.2 9.0  MG  --  1.9   GFR: Estimated Creatinine Clearance: 68.7 mL/min (by C-G formula based on SCr of 1.1 mg/dL). Liver Function Tests: No results for input(s): "AST", "ALT", "ALKPHOS", "BILITOT", "PROT", "ALBUMIN" in the last 168 hours. No results for input(s): "LIPASE", "AMYLASE" in the last 168 hours. No results for  input(s): "AMMONIA" in the last 168 hours. Coagulation Profile: No results for input(s): "INR", "PROTIME" in the last 168 hours. Cardiac Enzymes: No results for input(s): "CKTOTAL", "CKMB", "CKMBINDEX", "TROPONINI" in the last 168 hours. BNP (last 3 results) No results for input(s): "PROBNP" in the last 8760 hours. HbA1C: Recent Labs    10/22/22 0139  HGBA1C 7.0*   CBG: Recent Labs  Lab 10/22/22 0351 10/22/22 0754 10/22/22 1151 10/22/22 1632  GLUCAP 105* 116* 97 144*   Lipid Profile: No results for input(s): "CHOL", "HDL", "LDLCALC", "TRIG",  "CHOLHDL", "LDLDIRECT" in the last 72 hours. Thyroid Function Tests: No results for input(s): "TSH", "T4TOTAL", "FREET4", "T3FREE", "THYROIDAB" in the last 72 hours. Anemia Panel: No results for input(s): "VITAMINB12", "FOLATE", "FERRITIN", "TIBC", "IRON", "RETICCTPCT" in the last 72 hours. Urine analysis: No results found for: "COLORURINE", "APPEARANCEUR", "LABSPEC", "PHURINE", "GLUCOSEU", "HGBUR", "BILIRUBINUR", "KETONESUR", "PROTEINUR", "UROBILINOGEN", "NITRITE", "LEUKOCYTESUR" Sepsis Labs: @LABRCNTIP (procalcitonin:4,lacticidven:4)  )No results found for this or any previous visit (from the past 240 hour(s)).       Radiology Studies: ECHOCARDIOGRAM COMPLETE  Result Date: 10/22/2022    ECHOCARDIOGRAM REPORT   Patient Name:   Jackson Porter Date of Exam: 10/22/2022 Medical Rec #:  [id number]             Height:       68.0 in Accession #:    [id number]            Weight:       208.0 lb Date of Birth:  02/18/1951             BSA:          2.078 m Patient Age:    72 years              BP:           124/80 mmHg Patient Gender: M                     HR:           72 bpm. Exam Location:  Inpatient Procedure: 2D Echo, Cardiac Doppler and Color Doppler Indications:    CAD Native Vessel I25.10  History:        Patient has no prior history of Echocardiogram examinations.                 PAD, Signs/Symptoms:Chest Pain; Risk Factors:Diabetes and Former                 Smoker.  Sonographer:    Dondra Prader RVT RCS Referring Phys: [id number] MOHAN HARWANI IMPRESSIONS  1. Left ventricular ejection fraction, by estimation, is 60 to 65%. The left ventricle has normal function. The left ventricle has no regional wall motion abnormalities. Left ventricular diastolic parameters are consistent with Grade I diastolic dysfunction (impaired relaxation). Elevated left atrial pressure.  2. Right ventricular systolic function is normal. The right ventricular size is normal. Tricuspid regurgitation signal is inadequate for  assessing PA pressure.  3. The mitral valve is normal in structure. Mild mitral valve regurgitation. No evidence of mitral stenosis.  4. The aortic valve is tricuspid. There is mild calcification of the aortic valve. There is mild thickening of the aortic valve. Aortic valve regurgitation is not visualized. No aortic stenosis is present.  5. Aortic dilatation noted. There is mild dilatation of the ascending aorta, measuring 37 mm.  6. The inferior vena cava is normal in size with greater than 50% respiratory variability, suggesting right atrial  pressure of 3 mmHg. FINDINGS  Left Ventricle: Left ventricular ejection fraction, by estimation, is 60 to 65%. The left ventricle has normal function. The left ventricle has no regional wall motion abnormalities. The left ventricular internal cavity size was normal in size. There is  no left ventricular hypertrophy. Left ventricular diastolic parameters are consistent with Grade I diastolic dysfunction (impaired relaxation). Elevated left atrial pressure. Right Ventricle: The right ventricular size is normal. Right vetricular wall thickness was not well visualized. Right ventricular systolic function is normal. Tricuspid regurgitation signal is inadequate for assessing PA pressure. Left Atrium: Left atrial size was normal in size. Right Atrium: Right atrial size was normal in size. Pericardium: There is no evidence of pericardial effusion. Mitral Valve: The mitral valve is normal in structure. There is mild thickening of the mitral valve leaflet(s). There is mild calcification of the mitral valve leaflet(s). Mild mitral annular calcification. Mild mitral valve regurgitation. No evidence of  mitral valve stenosis. Tricuspid Valve: The tricuspid valve is normal in structure. Tricuspid valve regurgitation is not demonstrated. No evidence of tricuspid stenosis. Aortic Valve: The aortic valve is tricuspid. There is mild calcification of the aortic valve. There is mild thickening  of the aortic valve. There is mild aortic valve annular calcification. Aortic valve regurgitation is not visualized. No aortic stenosis  is present. Aortic valve mean gradient measures 5.0 mmHg. Aortic valve peak gradient measures 9.2 mmHg. Aortic valve area, by VTI measures 1.97 cm. Pulmonic Valve: The pulmonic valve was not well visualized. Pulmonic valve regurgitation is not visualized. No evidence of pulmonic stenosis. Aorta: The aortic root is normal in size and structure and aortic dilatation noted. There is mild dilatation of the ascending aorta, measuring 37 mm. Venous: The inferior vena cava is normal in size with greater than 50% respiratory variability, suggesting right atrial pressure of 3 mmHg. IAS/Shunts: No atrial level shunt detected by color flow Doppler.  LEFT VENTRICLE PLAX 2D LVIDd:         4.40 cm   Diastology LVIDs:         2.40 cm   LV e' medial:    5.00 cm/s LV PW:         1.00 cm   LV E/e' medial:  15.8 LV IVS:        1.00 cm   LV e' lateral:   5.44 cm/s LVOT diam:     2.00 cm   LV E/e' lateral: 14.5 LV SV:         60 LV SV Index:   29 LVOT Area:     3.14 cm  RIGHT VENTRICLE             IVC RV Basal diam:  3.40 cm     IVC diam: 2.10 cm RV S prime:     10.60 cm/s TAPSE (M-mode): 1.9 cm LEFT ATRIUM             Index        RIGHT ATRIUM           Index LA diam:        4.00 cm 1.92 cm/m   RA Area:     14.50 cm LA Vol (A2C):   48.8 ml 23.48 ml/m  RA Volume:   37.70 ml  18.14 ml/m LA Vol (A4C):   48.3 ml 23.24 ml/m LA Biplane Vol: 50.4 ml 24.25 ml/m  AORTIC VALVE  PULMONIC VALVE AV Area (Vmax):    2.05 cm     PV Vmax:       1.06 m/s AV Area (Vmean):   2.09 cm     PV Peak grad:  4.5 mmHg AV Area (VTI):     1.97 cm AV Vmax:           152.00 cm/s AV Vmean:          98.500 cm/s AV VTI:            0.304 m AV Peak Grad:      9.2 mmHg AV Mean Grad:      5.0 mmHg LVOT Vmax:         99.20 cm/s LVOT Vmean:        65.500 cm/s LVOT VTI:          0.191 m LVOT/AV VTI ratio: 0.63   AORTA Ao Root diam: 3.10 cm Ao Asc diam:  3.70 cm MITRAL VALVE MV Area (PHT): 2.67 cm     SHUNTS MV Decel Time: 284 msec     Systemic VTI:  0.19 m MV E velocity: 78.80 cm/s   Systemic Diam: 2.00 cm MV A velocity: 118.00 cm/s MV E/A ratio:  0.67 Dina Rich MD Electronically signed by Dina Rich MD Signature Date/Time: 10/22/2022/4:20:04 PM    Final    CT Angio Chest/Abd/Pel for Dissection W and/or Wo Contrast  Result Date: 10/21/2022 CLINICAL DATA:  Dizziness, throat tightness, chest pain EXAM: CT ANGIOGRAPHY CHEST, ABDOMEN AND PELVIS TECHNIQUE: Non-contrast CT of the chest was initially obtained. Multidetector CT imaging through the chest, abdomen and pelvis was performed using the standard protocol during bolus administration of intravenous contrast. Multiplanar reconstructed images and MIPs were obtained and reviewed to evaluate the vascular anatomy. RADIATION DOSE REDUCTION: This exam was performed according to the departmental dose-optimization program which includes automated exposure control, adjustment of the mA and/or kV according to patient size and/or use of iterative reconstruction technique. CONTRAST:  OMNIPAQUE IOHEXOL 350 MG/ML SOLN COMPARISON:  Chest radiographs 10/21/2022 and CTA chest abdomen and pelvis 12/18/2021 FINDINGS: CTA CHEST FINDINGS Cardiovascular: Coronary artery and aortic atherosclerotic calcification. No aortic aneurysm, dissection, or intramural hematoma. No pericardial effusion. Mediastinum/Nodes: Unremarkable trachea and esophagus. No thoracic adenopathy. Lungs/Pleura: Normal variant azygous lobe. No focal consolidation, pleural effusion, or pneumothorax. Musculoskeletal: No acute fracture. Review of the MIP images confirms the above findings. CTA ABDOMEN AND PELVIS FINDINGS VASCULAR Aorta: 3.3 cm infrarenal abdominal aorta or aortic aneurysm with eccentric thrombus. No dissection. Calcified atherosclerotic plaque. No significant stenosis. Celiac: Patent without  evidence of aneurysm, dissection, vasculitis or significant stenosis. SMA: Patent without evidence of aneurysm, dissection, vasculitis or significant stenosis. Renals: Both renal arteries are patent without evidence of aneurysm, dissection, vasculitis, fibromuscular dysplasia or significant stenosis. IMA: Patent. Inflow: Calcified atherosclerotic plaque with multifocal areas of narrowing greatest at the origin of the right common iliac artery and mid left common iliac artery where it is severe no severe. No aneurysm or dissection Veins: No obvious venous abnormality within the limitations of this arterial phase study. Review of the MIP images confirms the above findings. NON-VASCULAR Hepatobiliary: Cholecystectomy. Unremarkable liver and biliary tree. Pancreas: Unremarkable. Spleen: Unremarkable. Adrenals/Urinary Tract: Normal adrenal glands. No urinary calculi or hydronephrosis. Unremarkable bladder. Stomach/Bowel: Normal caliber large and small bowel. Colonic diverticulosis without diverticulitis. Lymphatic: No lymphadenopathy. Reproductive: Prostatic enlargement with brachytherapy beads. Other: No free intraperitoneal fluid or air. Musculoskeletal: No acute or significant osseous findings. Review of the MIP  images confirms the above findings. IMPRESSION: 1. Infrarenal abdominal aortic aneurysm measuring up to 3.3 cm. Recommend follow-up ultrasound every 3 years. This recommendation follows ACR consensus guidelines: White Paper of the ACR Incidental Findings Committee II on Vascular Findings. J Am Coll Radiol 2013; 10:789-794. 2. 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 3. No acute findings in the chest, abdomen, or pelvis. 4. Colonic diverticulosis without diverticulitis. Electronically Signed   By: Minerva Fester M.D.   On: 10/21/2022 22:55   DG Chest 2 View  Result Date: 10/21/2022 CLINICAL DATA:  Left-sided chest pain and left arm pain. EXAM: CHEST  - 2 VIEW COMPARISON:  December 18, 2021 FINDINGS: The heart size and mediastinal contours are within normal limits. Very mild atelectasis is noted within the left lung base. There is no evidence of acute infiltrate, pleural effusion or pneumothorax. The visualized skeletal structures are unremarkable. IMPRESSION: No active cardiopulmonary disease. Electronically Signed   By: Aram Candela M.D.   On: 10/21/2022 19:31        Scheduled Meds:  aspirin EC  81 mg Oral Daily   enoxaparin (LOVENOX) injection  40 mg Subcutaneous Q24H   insulin aspart  0-6 Units Subcutaneous Q4H   metoprolol succinate  50 mg Oral Daily   nitroGLYCERIN  0.5 inch Topical Q6H   pantoprazole  40 mg Oral Daily   ramipril  2.5 mg Oral Daily   rosuvastatin  40 mg Oral Daily   Continuous Infusions:   LOS: 0 days    Time spent: 35 minutes.    Berton Mount, MD  [facility] Pager #: [phone] 7PM-7AM contact night coverage as above

## 2022-10-22 NOTE — ED Notes (Signed)
Pt well appearing upon transport to floor.  

## 2022-10-22 NOTE — ED Notes (Signed)
ED TO INPATIENT HANDOFF REPORT  ED Nurse Name and Phone #: Laural Roes [phone]  S Name/Age/Gender Jackson Porter 72 y.o. male Room/Bed: 001C/001C  Code Status   Code Status: Full Code  Home/SNF/Other Home Patient oriented to: self, place, time, and situation Is this baseline? Yes   Triage Complete: Triage complete  Chief Complaint Chest pain [R07.9]  Triage Note Pt c/o dizziness, throat tightness, chest pain, pain to posterior head x 4 hours; pt took 81 mg asa pta; chest pain central, aching and heavy; endorses some sob and nausea; skin warm and dry; pt working in his shop at time of onset; hx cardiac stent placement, DM; pt speaking in full sentences without issue   Allergies Allergies  Allergen Reactions   Fluticasone Hives and Itching   Ibuprofen Other (See Comments)    Hives in high dosage    Level of Care/Admitting Diagnosis ED Disposition     ED Disposition  Admit   Condition  --   Comment  Hospital Area: [facility] [[id number]]  Level of Care: Telemetry Cardiac [[id number]]  May place patient in observation at [facility] or [facility] if equivalent level of care is available:: No  Covid Evaluation: Asymptomatic - no recent exposure (last 10 days) testing not required  Diagnosis: Chest pain [454098]  Admitting Physician: Briscoe Deutscher [[id number]]  Attending Physician: Briscoe Deutscher [[id number]]          B Medical/Surgery History Past Medical History:  Diagnosis Date   Coronary artery disease    Diabetes mellitus without complication (HCC)    Myocardial infarction [facility])    Past Surgical History:  Procedure Laterality Date   APPENDECTOMY     CHOLECYSTECTOMY N/A 01/02/2022   Procedure: LAPAROSCOPIC CHOLECYSTECTOMY;  Surgeon: [name], MD;  Location: [facility];  Service: General;  Laterality: N/A;   CORONARY STENT PLACEMENT     x3   HERNIA REPAIR Right    TONSILLECTOMY       A IV Location/Drains/Wounds Patient  Lines/Drains/Airways Status     Active Line/Drains/Airways     Name Placement date Placement time Site Days   Peripheral IV 10/21/22 20 G Anterior;Left Forearm 10/21/22  2149  Forearm  1   Incision (Closed) 01/02/22 Abdomen Other (Comment) 01/02/22  1008  -- 293   Incision - 4 Ports Abdomen Umbilicus Mid;Upper Right;Upper Right;Mid;Lateral 01/02/22  0956  -- 293            Intake/Output Last 24 hours No intake or output data in the 24 hours ending 10/22/22 1106  Labs/Imaging Results for orders placed or performed during the hospital encounter of 10/21/22 (from the past 48 hour(s))  Basic metabolic panel     Status: Abnormal   Collection Time: 10/21/22  6:22 PM  Result Value Ref Range   Sodium 139 135 - 145 mmol/L   Potassium 3.3 (L) 3.5 - 5.1 mmol/L   Chloride 106 98 - 111 mmol/L   CO2 20 (L) 22 - 32 mmol/L   Glucose, Bld 185 (H) 70 - 99 mg/dL    Comment: Glucose reference range applies only to samples taken after fasting for at least 8 hours.   BUN 12 8 - 23 mg/dL   Creatinine, Ser 0.86 (H) 0.61 - 1.24 mg/dL   Calcium 9.2 8.9 - 57.8 mg/dL   GFR, Estimated >46 >96 mL/min    Comment: (NOTE) Calculated using the CKD-EPI Creatinine Equation (2021)    Anion gap 13 5 - 15  Comment: Performed at [facility], [address], [place], [place] [address]  CBC     Status: Abnormal   Collection Time: 10/21/22  6:22 PM  Result Value Ref Range   WBC 12.0 (H) 4.0 - 10.5 K/uL   RBC 5.71 4.22 - 5.81 MIL/uL   Hemoglobin 16.7 13.0 - 17.0 g/dL   HCT 60.4 54.0 - 98.1 %   MCV 86.2 80.0 - 100.0 fL   MCH 29.2 26.0 - 34.0 pg   MCHC 33.9 30.0 - 36.0 g/dL   RDW 19.1 47.8 - 29.5 %   Platelets 157 150 - 400 K/uL   nRBC 0.0 0.0 - 0.2 %    Comment: Performed at [facility], [address] [place], [place] [address]  Troponin I (High Sensitivity)     Status: None   Collection Time: 10/21/22  6:22 PM  Result Value Ref Range   Troponin I (High Sensitivity) 11 <18 ng/L     Comment: (NOTE) Elevated high sensitivity troponin I (hsTnI) values and significant  changes across serial measurements may suggest ACS but many other  chronic and acute conditions are known to elevate hsTnI results.  Refer to the "Links" section for chest pain algorithms and additional  guidance. Performed at [facility], [address] [place], [place] [address]   D-dimer, quantitative     Status: Abnormal   Collection Time: 10/21/22  6:22 PM  Result Value Ref Range   D-Dimer, Quant 0.80 (H) 0.00 - 0.50 ug/mL-FEU    Comment: (NOTE) At the manufacturer cut-off value of 0.5 g/mL FEU, this assay has a negative predictive value of 95-100%.This assay is intended for use in conjunction with a clinical pretest probability (PTP) assessment model to exclude pulmonary embolism (PE) and deep venous thrombosis (DVT) in outpatients suspected of PE or DVT. Results should be correlated with clinical presentation. Performed at [facility], [address], [place], [place] [address]   Troponin I (High Sensitivity)     Status: None   Collection Time: 10/21/22  9:12 PM  Result Value Ref Range   Troponin I (High Sensitivity) 15 <18 ng/L    Comment: (NOTE) Elevated high sensitivity troponin I (hsTnI) values and significant  changes across serial measurements may suggest ACS but many other  chronic and acute conditions are known to elevate hsTnI results.  Refer to the "Links" section for chest pain algorithms and additional  guidance. Performed at [facility], [address] [place], [place] [address]   Hemoglobin A1c     Status: Abnormal   Collection Time: 10/22/22  1:39 AM  Result Value Ref Range   Hgb A1c MFr Bld 7.0 (H) 4.8 - 5.6 %    Comment: (NOTE) Pre diabetes:          5.7%-6.4%  Diabetes:              >6.4%  Glycemic control for   <7.0% adults with diabetes    Mean Plasma Glucose 154.2 mg/dL    Comment: Performed at [facility], [address] [place], [place] [address]  Basic metabolic panel     Status: Abnormal   Collection Time: 10/22/22  1:39 AM  Result Value Ref Range   Sodium 138 135 - 145 mmol/L   Potassium 3.3 (L) 3.5 - 5.1 mmol/L   Chloride 107 98 - 111 mmol/L   CO2 21 (L) 22 - 32 mmol/L   Glucose, Bld  106 (H) 70 - 99 mg/dL    Comment: Glucose reference range applies only to samples taken after fasting for at least 8 hours.   BUN 10 8 - 23 mg/dL   Creatinine, Ser 4.09 0.61 - 1.24 mg/dL   Calcium 9.0 8.9 - 81.1 mg/dL   GFR, Estimated >91 >47 mL/min    Comment: (NOTE) Calculated using the CKD-EPI Creatinine Equation (2021)    Anion gap 10 5 - 15    Comment: Performed at [facility], [address], [place], [place] [address]  Magnesium     Status: None   Collection Time: 10/22/22  1:39 AM  Result Value Ref Range   Magnesium 1.9 1.7 - 2.4 mg/dL    Comment: Performed at [facility], [address], [place], [place] [address]  CBC     Status: Abnormal   Collection Time: 10/22/22  1:39 AM  Result Value Ref Range   WBC 8.2 4.0 - 10.5 K/uL   RBC 5.53 4.22 - 5.81 MIL/uL   Hemoglobin 16.0 13.0 - 17.0 g/dL   HCT 65.7 84.6 - 96.2 %   MCV 87.5 80.0 - 100.0 fL   MCH 28.9 26.0 - 34.0 pg   MCHC 33.1 30.0 - 36.0 g/dL   RDW 95.2 84.1 - 32.4 %   Platelets 124 (L) 150 - 400 K/uL    Comment: REPEATED TO VERIFY   nRBC 0.0 0.0 - 0.2 %    Comment: Performed at [facility], [address] [place], [place] [address]  CBG monitoring, ED     Status: Abnormal   Collection Time: 10/22/22  3:51 AM  Result Value Ref Range   Glucose-Capillary 105 (H) 70 - 99 mg/dL    Comment: Glucose reference range applies only to samples taken after fasting for at least 8 hours.  CBG monitoring, ED     Status: Abnormal   Collection Time: 10/22/22  7:54 AM  Result Value Ref Range   Glucose-Capillary 116 (H) 70 - 99 mg/dL    Comment: Glucose reference range applies only to samples taken after fasting for at least 8 hours.    CT Angio Chest/Abd/Pel for Dissection W and/or Wo Contrast  Result Date: 10/21/2022 CLINICAL DATA:  Dizziness, throat tightness, chest pain EXAM: CT ANGIOGRAPHY CHEST, ABDOMEN AND PELVIS TECHNIQUE: Non-contrast CT of the chest was initially obtained. Multidetector CT imaging through the chest, abdomen and pelvis was performed using the standard protocol during bolus administration of intravenous contrast. Multiplanar reconstructed images and MIPs were obtained and reviewed to evaluate the vascular anatomy. RADIATION DOSE REDUCTION: This exam was performed according to the departmental dose-optimization program which includes automated exposure control, adjustment of the mA and/or kV according to patient size and/or use of iterative reconstruction technique. CONTRAST:  OMNIPAQUE IOHEXOL 350 MG/ML SOLN COMPARISON:  Chest radiographs 10/21/2022 and CTA chest abdomen and pelvis 12/18/2021 FINDINGS: CTA CHEST FINDINGS Cardiovascular: Coronary artery and aortic atherosclerotic calcification. No aortic aneurysm, dissection, or intramural hematoma. No pericardial effusion. Mediastinum/Nodes: Unremarkable trachea and esophagus. No thoracic adenopathy. Lungs/Pleura: Normal variant azygous lobe. No focal consolidation, pleural effusion, or pneumothorax. Musculoskeletal: No acute fracture. Review of the MIP images confirms the above findings. CTA ABDOMEN AND PELVIS FINDINGS VASCULAR Aorta: 3.3 cm infrarenal abdominal aorta or aortic aneurysm with eccentric thrombus. No dissection. Calcified atherosclerotic plaque. No significant stenosis. Celiac: Patent without evidence of aneurysm, dissection, vasculitis or significant stenosis. SMA: Patent without evidence of aneurysm, dissection, vasculitis or significant stenosis.  Renals: Both renal arteries are patent without evidence of aneurysm, dissection, vasculitis, fibromuscular dysplasia or significant stenosis. IMA: Patent. Inflow: Calcified atherosclerotic plaque with  multifocal areas of narrowing greatest at the origin of the right common iliac artery and mid left common iliac artery where it is severe no severe. No aneurysm or dissection Veins: No obvious venous abnormality within the limitations of this arterial phase study. Review of the MIP images confirms the above findings. NON-VASCULAR Hepatobiliary: Cholecystectomy. Unremarkable liver and biliary tree. Pancreas: Unremarkable. Spleen: Unremarkable. Adrenals/Urinary Tract: Normal adrenal glands. No urinary calculi or hydronephrosis. Unremarkable bladder. Stomach/Bowel: Normal caliber large and small bowel. Colonic diverticulosis without diverticulitis. Lymphatic: No lymphadenopathy. Reproductive: Prostatic enlargement with brachytherapy beads. Other: No free intraperitoneal fluid or air. Musculoskeletal: No acute or significant osseous findings. Review of the MIP images confirms the above findings. IMPRESSION: 1. Infrarenal abdominal aortic aneurysm measuring up to 3.3 cm. Recommend follow-up ultrasound every 3 years. This recommendation follows ACR consensus guidelines: White Paper of the ACR Incidental Findings Committee II on Vascular Findings. J Am Coll Radiol 2013; 10:789-794. 2. 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 3. No acute findings in the chest, abdomen, or pelvis. 4. Colonic diverticulosis without diverticulitis. Electronically Signed   By: Minerva Fester M.D.   On: 10/21/2022 22:55   DG Chest 2 View  Result Date: 10/21/2022 CLINICAL DATA:  Left-sided chest pain and left arm pain. EXAM: CHEST - 2 VIEW COMPARISON:  December 18, 2021 FINDINGS: The heart size and mediastinal contours are within normal limits. Very mild atelectasis is noted within the left lung base. There is no evidence of acute infiltrate, pleural effusion or pneumothorax. The visualized skeletal structures are unremarkable. IMPRESSION: No active cardiopulmonary disease.  Electronically Signed   By: Aram Candela M.D.   On: 10/21/2022 19:31    Pending Labs Unresulted Labs (From admission, onward)     Start     Ordered   10/29/22 0500  Creatinine, serum  (enoxaparin (LOVENOX)    CrCl >/= 30 ml/min)  Weekly,   R     Comments: while on enoxaparin therapy    10/22/22 0013            Vitals/Pain Today's Vitals   10/22/22 0300 10/22/22 0744 10/22/22 0830 10/22/22 0833  BP: (!) 140/106 133/87 132/83 132/83  Pulse: (!) 111 74 76 73  Resp: (!) 22 14 18    Temp:  98.1 F (36.7 C)    TempSrc:  Oral    SpO2: 96% 95% 97%   Weight:      Height:      PainSc:        Isolation Precautions No active isolations  Medications Medications  aspirin EC tablet 81 mg (81 mg Oral Given 10/22/22 0834)  metoprolol succinate (TOPROL-XL) 24 hr tablet 50 mg (50 mg Oral Given 10/22/22 0833)  ramipril (ALTACE) capsule 2.5 mg (2.5 mg Oral Given 10/22/22 0950)  pantoprazole (PROTONIX) EC tablet 40 mg (40 mg Oral Given 10/22/22 0834)  acetaminophen (TYLENOL) tablet 650 mg (has no administration in time range)  ondansetron (ZOFRAN) injection 4 mg (has no administration in time range)  insulin aspart (novoLOG) injection 0-6 Units ( Subcutaneous Not Given 10/22/22 0802)  enoxaparin (LOVENOX) injection 40 mg (has no administration in time range)  rosuvastatin (CRESTOR) tablet 40 mg (40 mg Oral Given 10/22/22 0834)  nitroGLYCERIN (NITROGLYN) 2 % ointment 0.5 inch (0.5 inches Topical Given 10/22/22 0835)  aspirin chewable tablet 243 mg (243 mg Oral Given 10/21/22 2122)  iohexol (OMNIPAQUE) 350 MG/ML injection 100 mL (100 mLs Intravenous Contrast Given 10/21/22 2230)  potassium chloride SA (KLOR-CON M) CR tablet 20 mEq (20 mEq Oral Given 10/22/22 0149)    Mobility walks     Focused Assessments Cardiac Assessment Handoff:  Cardiac Rhythm: Normal sinus rhythm No results found for: "CKTOTAL", "CKMB", "CKMBINDEX", "TROPONINI" Lab Results  Component Value Date   DDIMER 0.80 (H)  10/21/2022   Does the Patient currently have chest pain? No    R Recommendations: See Admitting Provider Note  Report given to:   Additional Notes:

## 2022-10-23 ENCOUNTER — Encounter: Payer: Self-pay | Admitting: Cardiology

## 2022-10-23 ENCOUNTER — Other Ambulatory Visit: Payer: Self-pay | Admitting: Internal Medicine

## 2022-10-23 ENCOUNTER — Observation Stay (HOSPITAL_COMMUNITY): Payer: Medicare Other

## 2022-10-23 DIAGNOSIS — I259 Chronic ischemic heart disease, unspecified: Secondary | ICD-10-CM | POA: Diagnosis not present

## 2022-10-23 LAB — CBC WITH DIFFERENTIAL/PLATELET
Abs Immature Granulocytes: 0.02 10*3/uL (ref 0.00–0.07)
Basophils Absolute: 0 10*3/uL (ref 0.0–0.1)
Basophils Relative: 0 %
Eosinophils Absolute: 0 10*3/uL (ref 0.0–0.5)
Eosinophils Relative: 0 %
HCT: 49.2 % (ref 39.0–52.0)
Hemoglobin: 16.3 g/dL (ref 13.0–17.0)
Immature Granulocytes: 0 %
Lymphocytes Relative: 21 %
Lymphs Abs: 1.7 10*3/uL (ref 0.7–4.0)
MCH: 29.4 pg (ref 26.0–34.0)
MCHC: 33.1 g/dL (ref 30.0–36.0)
MCV: 88.6 fL (ref 80.0–100.0)
Monocytes Absolute: 0.7 10*3/uL (ref 0.1–1.0)
Monocytes Relative: 8 %
Neutro Abs: 5.9 10*3/uL (ref 1.7–7.7)
Neutrophils Relative %: 71 %
Platelets: 133 10*3/uL — ABNORMAL LOW (ref 150–400)
RBC: 5.55 MIL/uL (ref 4.22–5.81)
RDW: 13.2 % (ref 11.5–15.5)
WBC: 8.3 10*3/uL (ref 4.0–10.5)
nRBC: 0 % (ref 0.0–0.2)

## 2022-10-23 LAB — RENAL FUNCTION PANEL
Albumin: 3.4 g/dL — ABNORMAL LOW (ref 3.5–5.0)
Anion gap: 7 (ref 5–15)
BUN: 12 mg/dL (ref 8–23)
CO2: 26 mmol/L (ref 22–32)
Calcium: 9 mg/dL (ref 8.9–10.3)
Chloride: 105 mmol/L (ref 98–111)
Creatinine, Ser: 1.25 mg/dL — ABNORMAL HIGH (ref 0.61–1.24)
GFR, Estimated: >60 mL/min (ref >60)
Glucose, Bld: 134 mg/dL — ABNORMAL HIGH (ref 70–99)
Phosphorus: 3.6 mg/dL (ref 2.5–4.6)
Potassium: 3.7 mmol/L (ref 3.5–5.1)
Sodium: 138 mmol/L (ref 135–145)

## 2022-10-23 LAB — MAGNESIUM: Magnesium: 2.1 mg/dL (ref 1.7–2.4)

## 2022-10-23 LAB — GLUCOSE, CAPILLARY
Glucose-Capillary: 146 mg/dL — ABNORMAL HIGH (ref 70–99)
Glucose-Capillary: 146 mg/dL — ABNORMAL HIGH (ref 70–99)
Glucose-Capillary: 149 mg/dL — ABNORMAL HIGH (ref 70–99)

## 2022-10-23 MED ORDER — ROSUVASTATIN CALCIUM 40 MG PO TABS: 40.0000 mg | ORAL_TABLET | Freq: Every day | ORAL | 1 refills | Status: DC

## 2022-10-23 MED ORDER — TECHNETIUM TC 99M TETROFOSMIN IV KIT
32.7000 | PACK | Freq: Once | INTRAVENOUS | Status: AC | PRN
  Administered 2022-10-23: 32.7 via INTRAVENOUS

## 2022-10-23 MED ORDER — REGADENOSON 0.4 MG/5ML IV SOLN
INTRAVENOUS | Status: AC
  Filled 2022-10-23: qty 5

## 2022-10-23 MED ORDER — TECHNETIUM TC 99M TETROFOSMIN IV KIT
10.8000 | PACK | Freq: Once | INTRAVENOUS | Status: AC | PRN
  Administered 2022-10-23: 10.8 via INTRAVENOUS

## 2022-10-23 MED ORDER — REGADENOSON 0.4 MG/5ML IV SOLN
0.4000 mg | Freq: Once | INTRAVENOUS | Status: AC
  Administered 2022-10-23: 0.4 mg via INTRAVENOUS

## 2022-10-23 NOTE — Progress Notes (Signed)
Subjective:  Patient denies any chest pain or shortness of breath.  Underwent nuclear stress test today results pending discussed with patient again regarding bilateral iliac stenosis and now willing to see interventional cardiologist but wanted to be scheduled peripheral angiogram as  outpatient   Objective:  Vital Signs in the last 24 hours: Temp:  [97.7 F (36.5 C)-98.2 F (36.8 C)] 97.7 F (36.5 C) (06/10 0715) Pulse Rate:  [69-72] 69 (06/10 0815) Resp:  [16] 16 (06/10 0715) BP: (114-144)/(63-87) 128/75 (06/10 1325) SpO2:  [92 %-97 %] 97 % (06/10 0715)  Intake/Output from previous day: No intake/output data recorded. Intake/Output from this shift: No intake/output data recorded.  Physical Exam: Unchanged.  Lab Results: Recent Labs    10/22/22 0139 10/23/22 0314  WBC 8.2 8.3  HGB 16.0 16.3  PLT 124* 133*   Recent Labs    10/22/22 0139 10/23/22 0314  NA 138 138  K 3.3* 3.7  CL 107 105  CO2 21* 26  GLUCOSE 106* 134*  BUN 10 12  CREATININE 1.10 1.25*   No results for input(s): "TROPONINI" in the last 72 hours.  Invalid input(s): "CK", "MB" Hepatic Function Panel Recent Labs    10/23/22 0314  ALBUMIN 3.4*   No results for input(s): "CHOL" in the last 72 hours. No results for input(s): "PROTIME" in the last 72 hours.  Imaging: Imaging results have been reviewed and ECHOCARDIOGRAM COMPLETE  Result Date: 10/22/2022    ECHOCARDIOGRAM REPORT   Patient Name:   Jackson Porter Date of Exam: 10/22/2022 Medical Rec #:  [id number]             Height:       68.0 in Accession #:    [id number]            Weight:       208.0 lb Date of Birth:  [birth year]             BSA:          2.078 m Patient Age:    72 years              BP:           124/80 mmHg Patient Gender: M                     HR:           72 bpm. Exam Location:  Inpatient Procedure: 2D Echo, Cardiac Doppler and Color Doppler Indications:    CAD Native Vessel I25.10  History:        Patient has no prior  history of Echocardiogram examinations.                 PAD, Signs/Symptoms:Chest Pain; Risk Factors:Diabetes and Former                 Smoker.  Sonographer:    Dondra Prader RVT RCS Referring Phys: [id number] Lynnett Langlinais IMPRESSIONS  1. Left ventricular ejection fraction, by estimation, is 60 to 65%. The left ventricle has normal function. The left ventricle has no regional wall motion abnormalities. Left ventricular diastolic parameters are consistent with Grade I diastolic dysfunction (impaired relaxation). Elevated left atrial pressure.  2. Right ventricular systolic function is normal. The right ventricular size is normal. Tricuspid regurgitation signal is inadequate for assessing PA pressure.  3. The mitral valve is normal in structure. Mild mitral valve regurgitation. No evidence of mitral stenosis.  4. The aortic valve is tricuspid.  There is mild calcification of the aortic valve. There is mild thickening of the aortic valve. Aortic valve regurgitation is not visualized. No aortic stenosis is present.  5. Aortic dilatation noted. There is mild dilatation of the ascending aorta, measuring 37 mm.  6. The inferior vena cava is normal in size with greater than 50% respiratory variability, suggesting right atrial pressure of 3 mmHg. FINDINGS  Left Ventricle: Left ventricular ejection fraction, by estimation, is 60 to 65%. The left ventricle has normal function. The left ventricle has no regional wall motion abnormalities. The left ventricular internal cavity size was normal in size. There is  no left ventricular hypertrophy. Left ventricular diastolic parameters are consistent with Grade I diastolic dysfunction (impaired relaxation). Elevated left atrial pressure. Right Ventricle: The right ventricular size is normal. Right vetricular wall thickness was not well visualized. Right ventricular systolic function is normal. Tricuspid regurgitation signal is inadequate for assessing PA pressure. Left Atrium: Left atrial  size was normal in size. Right Atrium: Right atrial size was normal in size. Pericardium: There is no evidence of pericardial effusion. Mitral Valve: The mitral valve is normal in structure. There is mild thickening of the mitral valve leaflet(s). There is mild calcification of the mitral valve leaflet(s). Mild mitral annular calcification. Mild mitral valve regurgitation. No evidence of  mitral valve stenosis. Tricuspid Valve: The tricuspid valve is normal in structure. Tricuspid valve regurgitation is not demonstrated. No evidence of tricuspid stenosis. Aortic Valve: The aortic valve is tricuspid. There is mild calcification of the aortic valve. There is mild thickening of the aortic valve. There is mild aortic valve annular calcification. Aortic valve regurgitation is not visualized. No aortic stenosis  is present. Aortic valve mean gradient measures 5.0 mmHg. Aortic valve peak gradient measures 9.2 mmHg. Aortic valve area, by VTI measures 1.97 cm. Pulmonic Valve: The pulmonic valve was not well visualized. Pulmonic valve regurgitation is not visualized. No evidence of pulmonic stenosis. Aorta: The aortic root is normal in size and structure and aortic dilatation noted. There is mild dilatation of the ascending aorta, measuring 37 mm. Venous: The inferior vena cava is normal in size with greater than 50% respiratory variability, suggesting right atrial pressure of 3 mmHg. IAS/Shunts: No atrial level shunt detected by color flow Doppler.  LEFT VENTRICLE PLAX 2D LVIDd:         4.40 cm   Diastology LVIDs:         2.40 cm   LV e' medial:    5.00 cm/s LV PW:         1.00 cm   LV E/e' medial:  15.8 LV IVS:        1.00 cm   LV e' lateral:   5.44 cm/s LVOT diam:     2.00 cm   LV E/e' lateral: 14.5 LV SV:         60 LV SV Index:   29 LVOT Area:     3.14 cm  RIGHT VENTRICLE             IVC RV Basal diam:  3.40 cm     IVC diam: 2.10 cm RV S prime:     10.60 cm/s TAPSE (M-mode): 1.9 cm LEFT ATRIUM             Index         RIGHT ATRIUM           Index LA diam:        4.00 cm 1.92 cm/m  RA Area:     14.50 cm LA Vol (A2C):   48.8 ml 23.48 ml/m  RA Volume:   37.70 ml  18.14 ml/m LA Vol (A4C):   48.3 ml 23.24 ml/m LA Biplane Vol: 50.4 ml 24.25 ml/m  AORTIC VALVE                    PULMONIC VALVE AV Area (Vmax):    2.05 cm     PV Vmax:       1.06 m/s AV Area (Vmean):   2.09 cm     PV Peak grad:  4.5 mmHg AV Area (VTI):     1.97 cm AV Vmax:           152.00 cm/s AV Vmean:          98.500 cm/s AV VTI:            0.304 m AV Peak Grad:      9.2 mmHg AV Mean Grad:      5.0 mmHg LVOT Vmax:         99.20 cm/s LVOT Vmean:        65.500 cm/s LVOT VTI:          0.191 m LVOT/AV VTI ratio: 0.63  AORTA Ao Root diam: 3.10 cm Ao Asc diam:  3.70 cm MITRAL VALVE MV Area (PHT): 2.67 cm     SHUNTS MV Decel Time: 284 msec     Systemic VTI:  0.19 m MV E velocity: 78.80 cm/s   Systemic Diam: 2.00 cm MV A velocity: 118.00 cm/s MV E/A ratio:  0.67 Dina Rich MD Electronically signed by Dina Rich MD Signature Date/Time: 10/22/2022/4:20:04 PM    Final    CT Angio Chest/Abd/Pel for Dissection W and/or Wo Contrast  Result Date: 10/21/2022 CLINICAL DATA:  Dizziness, throat tightness, chest pain EXAM: CT ANGIOGRAPHY CHEST, ABDOMEN AND PELVIS TECHNIQUE: Non-contrast CT of the chest was initially obtained. Multidetector CT imaging through the chest, abdomen and pelvis was performed using the standard protocol during bolus administration of intravenous contrast. Multiplanar reconstructed images and MIPs were obtained and reviewed to evaluate the vascular anatomy. RADIATION DOSE REDUCTION: This exam was performed according to the departmental dose-optimization program which includes automated exposure control, adjustment of the mA and/or kV according to patient size and/or use of iterative reconstruction technique. CONTRAST:  OMNIPAQUE IOHEXOL 350 MG/ML SOLN COMPARISON:  Chest radiographs 10/21/2022 and CTA chest abdomen and pelvis 12/18/2021  FINDINGS: CTA CHEST FINDINGS Cardiovascular: Coronary artery and aortic atherosclerotic calcification. No aortic aneurysm, dissection, or intramural hematoma. No pericardial effusion. Mediastinum/Nodes: Unremarkable trachea and esophagus. No thoracic adenopathy. Lungs/Pleura: Normal variant azygous lobe. No focal consolidation, pleural effusion, or pneumothorax. Musculoskeletal: No acute fracture. Review of the MIP images confirms the above findings. CTA ABDOMEN AND PELVIS FINDINGS VASCULAR Aorta: 3.3 cm infrarenal abdominal aorta or aortic aneurysm with eccentric thrombus. No dissection. Calcified atherosclerotic plaque. No significant stenosis. Celiac: Patent without evidence of aneurysm, dissection, vasculitis or significant stenosis. SMA: Patent without evidence of aneurysm, dissection, vasculitis or significant stenosis. Renals: Both renal arteries are patent without evidence of aneurysm, dissection, vasculitis, fibromuscular dysplasia or significant stenosis. IMA: Patent. Inflow: Calcified atherosclerotic plaque with multifocal areas of narrowing greatest at the origin of the right common iliac artery and mid left common iliac artery where it is severe no severe. No aneurysm or dissection Veins: No obvious venous abnormality within the limitations of this arterial phase study. Review of the MIP images confirms the  above findings. NON-VASCULAR Hepatobiliary: Cholecystectomy. Unremarkable liver and biliary tree. Pancreas: Unremarkable. Spleen: Unremarkable. Adrenals/Urinary Tract: Normal adrenal glands. No urinary calculi or hydronephrosis. Unremarkable bladder. Stomach/Bowel: Normal caliber large and small bowel. Colonic diverticulosis without diverticulitis. Lymphatic: No lymphadenopathy. Reproductive: Prostatic enlargement with brachytherapy beads. Other: No free intraperitoneal fluid or air. Musculoskeletal: No acute or significant osseous findings. Review of the MIP images confirms the above findings.  IMPRESSION: 1. Infrarenal abdominal aortic aneurysm measuring up to 3.3 cm. Recommend follow-up ultrasound every 3 years. This recommendation follows ACR consensus guidelines: White Paper of the ACR Incidental Findings Committee II on Vascular Findings. J Am Coll Radiol 2013; 10:789-794. 2. 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 3. No acute findings in the chest, abdomen, or pelvis. 4. Colonic diverticulosis without diverticulitis. Electronically Signed   By: Minerva Fester M.D.   On: 10/21/2022 22:55   DG Chest 2 View  Result Date: 10/21/2022 CLINICAL DATA:  Left-sided chest pain and left arm pain. EXAM: CHEST - 2 VIEW COMPARISON:  December 18, 2021 FINDINGS: The heart size and mediastinal contours are within normal limits. Very mild atelectasis is noted within the left lung base. There is no evidence of acute infiltrate, pleural effusion or pneumothorax. The visualized skeletal structures are unremarkable. IMPRESSION: No active cardiopulmonary disease. Electronically Signed   By: Aram Candela M.D.   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 Small abdominal aortic aneurysm Thrombocytopenia Plan Continue present management Will call interventional cardiology for further evaluation and management. Discussed with patient again and agrees  LOS: 0 days    Rinaldo Cloud 10/23/2022, 3:02 PM

## 2022-10-23 NOTE — Care Management Obs Status (Signed)
MEDICARE OBSERVATION STATUS NOTIFICATION   Patient Details  Name: GEO SLONE MRN: [id number] Date of Birth: 11/17/1950   Medicare Observation Status Notification Given:  Yes    Ronny Bacon, RN 10/23/2022, 10:23 AM

## 2022-10-23 NOTE — Progress Notes (Signed)
Will arrange outpatient evaluation for. PAD and AAA follow up

## 2022-10-23 NOTE — Discharge Summary (Signed)
Physician Discharge Summary  Patient ID: Jackson Porter MRN: [id number] DOB/AGE: 71/06/1950 71 y.o.  Admit date: 10/21/2022 Discharge date: 10/23/2022  Admission Diagnoses:  Discharge Diagnoses:  Principal Problem:   Chest pain Active Problems:   Type 2 diabetes mellitus (HCC)   Hypokalemia   Infrarenal abdominal aortic aneurysm (AAA) without rupture [facility])   Discharged Condition:   Hospital Course:  Patient is a 72 year old male past medical history significant for type 2 diabetes mellitus and coronary artery disease status post PCI, hypertension and hyperlipidemia.  Patient was admitted with chest pain.  Patient was well-known to the cardiology team.  Troponins came back negative.  Cardiac stress test also came back negative.  CT of the chest, abdomen and pelvis revealed peripheral vascular disease and infrarenal abdominal aortic aneurysm.  Interventional radiology team intends to workup PVD and infrarenal AAA on patient basis.  Patient has remained chest free.  Cardiology has cleared patient for discharge.    1. Chest pain; CAD  -Negative troponin. -Cardiology directed care.   -Cardiac stress test was negative.   -Patient has remained chest pain-free.. -Patient will follow-up with primary care provider and cardiology team on discharge.   2. Type II DM  - A1c 7%. -Continue to optimize blood sugar control.       3. Hypokalemia  -Continue to monitor and replace.      4. AAA  - 3.3 cm infrarenal AAA noted on CT in ED  - Outpatient follow-up recommended     Severe bilateral stenosis of the common iliac artery: -Cardiology prefers to manage this on outpatient basis.   Consults: cardiology. Interventional cardiology team Yates Decamp, MD) will arrange evaluation for peripheral artery disease and abdominal aortic aneurysm on outpatient basis.  Significant Diagnostic Studies:  Cardiac stress test revealed: 1. No reversible ischemia or infarction.   2. Normal left  ventricular wall motion.   3. Left ventricular ejection fraction 62%   4. Non invasive risk stratification*: Low   Echocardiogram revealed: 1. Left ventricular ejection fraction, by estimation, is 60 to 65%. The  left ventricle has normal function. The left ventricle has no regional  wall motion abnormalities. Left ventricular diastolic parameters are  consistent with Grade I diastolic  dysfunction (impaired relaxation). Elevated left atrial pressure.   2. Right ventricular systolic function is normal. The right ventricular  size is normal. Tricuspid regurgitation signal is inadequate for assessing  PA pressure.   3. The mitral valve is normal in structure. Mild mitral valve  regurgitation. No evidence of mitral stenosis.   4. The aortic valve is tricuspid. There is mild calcification of the  aortic valve. There is mild thickening of the aortic valve. Aortic valve  regurgitation is not visualized. No aortic stenosis is present.   5. Aortic dilatation noted. There is mild dilatation of the ascending  aorta, measuring 37 mm.   6. The inferior vena cava is normal in size with greater than 50%  respiratory variability, suggesting right atrial pressure of 3 mmHg.   CTA chest, abdomen and pelvis revealed: 1. Infrarenal abdominal aortic aneurysm measuring up to 3.3 cm. Recommend follow-up ultrasound every 3 years. This recommendation follows ACR consensus guidelines: White Paper of the ACR Incidental Findings Committee II on Vascular Findings. J Am Coll Radiol 2013; 10:789-794. 2. 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 3. No acute findings in the chest, abdomen, or pelvis. 4. Colonic diverticulosis without diverticulitis.  Troponins came  back negative.  Discharge Exam: Blood pressure 137/70, pulse 69, temperature 98 F (36.7 C), temperature source Oral, resp. rate 16, height 5\' 8"  (1.727 m), weight 94.3 kg, SpO2 97  %.   Disposition: Discharge disposition: 01-Home or Self Care       Discharge Instructions     Diet - low sodium heart healthy   Complete by: As directed    Diet Carb Modified   Complete by: As directed    Increase activity slowly   Complete by: As directed       Allergies as of 10/23/2022       Reactions   Fluticasone Hives, Itching   Ibuprofen Other (See Comments)   Hives in high dosage        Medication List     STOP taking these medications    ibuprofen 200 MG tablet Commonly known as: ADVIL   PROSTATE PO       TAKE these medications    aspirin EC 81 MG tablet Take 81 mg by mouth daily. Swallow whole.   glimepiride 4 MG tablet Commonly known as: AMARYL   Jardiance 10 MG Tabs tablet Generic drug: empagliflozin   metoprolol succinate 50 MG 24 hr tablet Commonly known as: TOPROL-XL   nitroGLYCERIN 0.4 MG SL tablet Commonly known as: NITROSTAT Place under the tongue.   oxyCODONE-acetaminophen 5-325 MG tablet Commonly known as: Percocet Take 1 tablet by mouth every 4 (four) hours as needed for severe pain.   Ozempic (0.25 or 0.5 MG/DOSE) 2 MG/3ML Sopn Generic drug: Semaglutide(0.25 or 0.5MG /DOS) Inject into the skin.   pantoprazole 40 MG tablet Commonly known as: PROTONIX Take 1 tablet by mouth daily.   ramipril 2.5 MG capsule Commonly known as: ALTACE Take 2.5 mg by mouth daily.   rosuvastatin 40 MG tablet Commonly known as: CRESTOR Take 1 tablet (40 mg total) by mouth daily. Start taking on: October 24, 2022 What changed:  medication strength how much to take how to take this when to take this   sucralfate 1 g tablet Commonly known as: Carafate Take 1 tablet (1 g total) by mouth 4 (four) times daily -  with meals and at bedtime.   VITAMIN D PO Take by mouth.         SignedBarnetta Chapel 10/23/2022, 4:13 PM

## 2022-10-23 NOTE — TOC Initial Note (Signed)
Transition of Care [facility]) - Initial/Assessment Note    Patient Details  Name: Jackson Porter MRN: [id number] Date of Birth: [birth year]  Transition of [facility]) CM/SW Contact:    Ronny Bacon, RN Phone Number: 10/23/2022, 10:18 AM  Clinical Narrative:  Spoke with patient at bedside. Patient lives at home with spouse, does not have DME at home, patient works part time. PCP is Dr  Lewie Chamber and Cardiology is Dr. Sharyn Lull. [facility] uses [facility] on [address] for pharmacy. Wife will transport patient home or he has ability to pay for [facility].               Expected Discharge Plan: Home/Self Care Barriers to Discharge: Continued Medical Work up   Patient Goals and CMS Choice Patient states their goals for this hospitalization and ongoing recovery are:: To go home          Expected Discharge Plan and Services       Living arrangements for the past 2 months: Single Family Home                                      Prior Living Arrangements/Services Living arrangements for the past 2 months: Single Family Home Lives with:: Spouse Patient language and need for interpreter reviewed:: Yes Do you feel safe going back to the place where you live?: Yes      Need for Family Participation in Patient Care: No (Comment) Care giver support system in place?: Yes (comment)   Criminal Activity/Legal Involvement Pertinent to Current Situation/Hospitalization: No - Comment as needed  Activities of Daily Living Home Assistive Devices/Equipment: None ADL Screening (condition at time of admission) Patient's cognitive ability adequate to safely complete daily activities?: Yes Is the patient deaf or have difficulty hearing?: No Does the patient have difficulty seeing, even when wearing glasses/contacts?: No Does the patient have difficulty concentrating, remembering, or making decisions?: No Patient able to express need for assistance with ADLs?: Yes Does the patient have  difficulty dressing or bathing?: No Independently performs ADLs?: Yes (appropriate for developmental age) Does the patient have difficulty walking or climbing stairs?: Yes Weakness of Legs: Right Weakness of Arms/Hands: Both  Permission Sought/Granted Permission sought to share information with : Case Manager Permission granted to share information with : Yes, Verbal Permission Granted              Emotional Assessment Appearance:: Appears younger than stated age Attitude/Demeanor/Rapport: Engaged Affect (typically observed): Appropriate Orientation: : Oriented to Self, Oriented to Place, Oriented to  Time, Oriented to Situation Alcohol / Substance Use: Not Applicable Psych Involvement: No (comment)  Admission diagnosis:  Atypical chest pain [R07.89] Chest pain [R07.9] Patient Active Problem List   Diagnosis Date Noted   Hypokalemia 10/22/2022   Infrarenal abdominal aortic aneurysm (AAA) without rupture (HCC) 10/22/2022   Chest pain 10/21/2022   Empyema of gallbladder    Type 2 diabetes mellitus (HCC) 11/09/2021   Enlarged prostate with urinary obstruction 07/21/2020   Bilateral impacted cerumen 03/26/2020   Acute bronchitis 02/27/2019   PCP:  Irven Coe, MD Pharmacy:   [facility] [id number] - [address], [place] - [address] [facility] [address] [place] [address] Phone: [phone] Fax: [phone]  [id number] [place], [place] - [address] AT [facility] [address] [place] [address] Phone: [phone]  Fax: [phone]     Social Determinants of Health (SDOH) Social History: SDOH Screenings   Food Insecurity: No Food Insecurity (10/22/2022)  Housing: Patient Declined (10/22/2022)  Transportation Needs: No Transportation Needs (10/22/2022)  Utilities: Not At Risk (10/22/2022)  Tobacco Use: Medium Risk (10/22/2022)   SDOH Interventions:     Readmission Risk Interventions     No  data to display

## 2022-11-13 DIAGNOSIS — B9689 Other specified bacterial agents as the cause of diseases classified elsewhere: Secondary | ICD-10-CM | POA: Diagnosis not present

## 2022-11-13 DIAGNOSIS — Z0130 Encounter for examination of blood pressure without abnormal findings: Secondary | ICD-10-CM | POA: Diagnosis not present

## 2022-11-13 DIAGNOSIS — J069 Acute upper respiratory infection, unspecified: Secondary | ICD-10-CM | POA: Diagnosis not present

## 2022-11-13 DIAGNOSIS — J029 Acute pharyngitis, unspecified: Secondary | ICD-10-CM | POA: Diagnosis not present

## 2022-11-27 DIAGNOSIS — I2510 Atherosclerotic heart disease of native coronary artery without angina pectoris: Secondary | ICD-10-CM | POA: Diagnosis not present

## 2022-11-27 DIAGNOSIS — E782 Mixed hyperlipidemia: Secondary | ICD-10-CM | POA: Diagnosis not present

## 2022-11-27 DIAGNOSIS — E119 Type 2 diabetes mellitus without complications: Secondary | ICD-10-CM | POA: Diagnosis not present

## 2022-11-27 DIAGNOSIS — I10 Essential (primary) hypertension: Secondary | ICD-10-CM | POA: Diagnosis not present

## 2022-12-04 ENCOUNTER — Ambulatory Visit: Payer: Medicare Other | Admitting: Cardiology

## 2022-12-04 ENCOUNTER — Encounter: Payer: Self-pay | Admitting: Cardiology

## 2022-12-04 VITALS — BP 140/76 | HR 69 | Resp 16 | Ht 68.0 in | Wt 206.0 lb

## 2022-12-04 DIAGNOSIS — I7143 Infrarenal abdominal aortic aneurysm, without rupture: Secondary | ICD-10-CM

## 2022-12-04 DIAGNOSIS — I739 Peripheral vascular disease, unspecified: Secondary | ICD-10-CM

## 2022-12-04 NOTE — Progress Notes (Signed)
Primary Physician/Referring:  Irven Coe, MD  Patient ID: Jackson Porter, male    DOB: [birth year], 72 y.o.   MRN: [id number]  Chief Complaint  Patient presents with   PAD   New Patient (Initial Visit)   HPI:    Jackson Porter  is a 72 y.o. Caucasian male patient with coronary disease, history of angioplasty and stenting to RCA, LAD and Cx in 2007, hypertension, hypercholesterolemia, diabetes mellitus, 30-pack-year history of smoking quit after his myocardial infarction 2007 referred to me for evaluation of peripheral artery disease  Patient referred to me for evaluation of peripheral arterial disease.  He is accompanied by his wife.  States that he is fairly active, but has been having difficulty walking and has to frequently stop.  States that he is only able to walk half a block before having to stop due to bilateral calf and hip claudication in the form of cramping but also feels very weak in his legs.  Denies chest pain or dyspnea.  Past Medical History:  Diagnosis Date   Coronary artery disease    Diabetes mellitus without complication (HCC)    Myocardial infarction [facility])    Past Surgical History:  Procedure Laterality Date   APPENDECTOMY     CHOLECYSTECTOMY N/A 01/02/2022   Procedure: LAPAROSCOPIC CHOLECYSTECTOMY;  Surgeon: [name], MD;  Location: [facility];  Service: General;  Laterality: N/A;   CORONARY STENT PLACEMENT     x3   HERNIA REPAIR Right    TONSILLECTOMY     Family History  Problem Relation Age of Onset   Heart attack Brother     Social History   Tobacco Use   Smoking status: Former    Current packs/day: 1.50    Average packs/day: 1.1 packs/day for 59.9 years (68.2 ttl pk-yrs)    Types: Cigarettes    Start date: 12/27/1962   Smokeless tobacco: Never  Substance Use Topics   Alcohol use: No   Marital Status: Married  ROS  Review of Systems  Cardiovascular:  Positive for claudication (bilateral calf and buttocks). Negative for chest  pain, dyspnea on exertion and leg swelling.   Objective      12/04/2022    9:11 AM 10/23/2022    3:55 PM 10/23/2022    1:25 PM  Vitals with BMI  Height 5\' 8"     Weight 206 lbs    BMI 31.33    Systolic 140 137 409  Diastolic 76 70 75  Pulse 69     Blood pressure (!) 140/76, pulse 69, resp. rate 16, height 5\' 8"  (1.727 m), weight 206 lb (93.4 kg), SpO2 96%.  Physical Exam Neck:     Vascular: No carotid bruit or JVD.  Cardiovascular:     Rate and Rhythm: Normal rate and regular rhythm.     Pulses:          Femoral pulses are 1+ on the right side and 2+ on the left side with bruit.      Popliteal pulses are 0 on the right side and 2+ on the left side.       Dorsalis pedis pulses are 0 on the right side and 0 on the left side.       Posterior tibial pulses are 1+ on the right side and 2+ on the left side.     Heart sounds: Normal heart sounds. No murmur heard.    No gallop.  Pulmonary:     Effort: Pulmonary effort is normal.  Breath sounds: Normal breath sounds.  Abdominal:     General: Bowel sounds are normal.     Palpations: Abdomen is soft.  Musculoskeletal:     Right lower leg: No edema.     Left lower leg: No edema.     Comments: Mild varicose veins bilateral    Laboratory examination:   Recent Labs    10/21/22 1822 10/22/22 0139 10/23/22 0314  NA 139 138 138  K 3.3* 3.3* 3.7  CL 106 107 105  CO2 20* 21* 26  GLUCOSE 185* 106* 134*  BUN 12 10 12   CREATININE 1.26* 1.10 1.25*  CALCIUM 9.2 9.0 9.0  GFRNONAA >60 >60 >60    Lab Results  Component Value Date   GLUCOSE 134 (H) 10/23/2022   NA 138 10/23/2022   K 3.7 10/23/2022   CL 105 10/23/2022   CO2 26 10/23/2022   BUN 12 10/23/2022   CREATININE 1.25 (H) 10/23/2022   GFRNONAA >60 10/23/2022   CALCIUM 9.0 10/23/2022   PHOS 3.6 10/23/2022   PROT 6.8 12/18/2021   ALBUMIN 3.4 (L) 10/23/2022   BILITOT 1.0 12/18/2021   ALKPHOS 58 12/18/2021   AST 39 12/18/2021   ALT 44 12/18/2021   ANIONGAP 7  10/23/2022      Lab Results  Component Value Date   ALT 44 12/18/2021   AST 39 12/18/2021   ALKPHOS 58 12/18/2021   BILITOT 1.0 12/18/2021       Latest Ref Rng & Units 10/23/2022    3:14 AM 10/22/2022    1:39 AM 10/21/2022    6:22 PM  CBC  WBC 4.0 - 10.5 K/uL 8.3  8.2  12.0   Hemoglobin 13.0 - 17.0 g/dL 09.8  11.9  14.7   Hematocrit 39.0 - 52.0 % 49.2  48.4  49.2   Platelets 150 - 400 K/uL 133  124  157        Latest Ref Rng & Units 10/23/2022    3:14 AM 12/18/2021    5:40 PM 10/29/2007    4:00 PM  Hepatic Function  Total Protein 6.5 - 8.1 g/dL  6.8  6.6   Albumin 3.5 - 5.0 g/dL 3.4  4.0  4.1   AST 15 - 41 U/L  39  33   ALT 0 - 44 U/L  44  36   Alk Phosphatase 38 - 126 U/L  58  69   Total Bilirubin 0.3 - 1.2 mg/dL  1.0  0.7   Bilirubin, Direct 0.0 - 0.2 mg/dL  0.2      HEMOGLOBIN W2N Lab Results  Component Value Date   HGBA1C 7.0 (H) 10/22/2022   MPG 154.2 10/22/2022   External labs:   Labs 09/04/2022:  A1c 6.3%.  TSH normal at 0.78.  Hb 13.1/HCT 39.0, platelets 160.  Serum glucose 235, BUN 9, creatinine 0.92, EGFR 89 mm, LFTs normal.  Labs 12/19/2021:  Total cholesterol 05/16/2015, triglycerides 84, HDL 44, LDL 56.  Non-HDL cholesterol 72.  Radiology:   Chest x-ray 10/21/2022: The heart size and mediastinal contours are within normal limits. Very mild atelectasis is noted within the left lung base. There is no evidence of acute infiltrate, pleural effusion or pneumothorax. The visualized skeletal structures are unremarkable.  CT angiogram chest, abdomen and pelvis dissection protocol 10/21/2022: 1. Infrarenal abdominal aortic aneurysm measuring up to 3.3 cm. Recommend follow-up ultrasound every 3 years. This recommendation follows ACR consensus guidelines: White Paper of the ACR IncidentalFindings Committee II on Vascular Findings. J Am  Coll Radiol 2013;10:789-794. 2. 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 3. No acute findings in the chest, abdomen, or pelvis. 4. Colonic diverticulosis without diverticulitis.  Cardiac Studies:   Regadenoson Nuclear stress test 10/23/2022: 1. No reversible ischemia or infarction.   2. Normal left ventricular wall motion.   3. Left ventricular ejection fraction 62%   4. Non invasive risk stratification*: Low  Echocardiogram 10/22/2022:  1. Left ventricular ejection fraction, by estimation, is 60 to 65%. The left ventricle has normal function. The left ventricle has no regional wall motion abnormalities. Left ventricular diastolic parameters are consistent with Grade I diastolic  dysfunction (impaired relaxation). Elevated left atrial pressure.  2. Right ventricular systolic function is normal. The right ventricular size is normal. Tricuspid regurgitation signal is inadequate for assessing PA pressure.  3. The mitral valve is normal in structure. Mild mitral valve regurgitation. No evidence of mitral stenosis.  4. The aortic valve is tricuspid. There is mild calcification of the aortic valve. There is mild thickening of the aortic valve. Aortic valve regurgitation is not visualized. No aortic stenosis is present.  5. Aortic dilatation noted. There is mild dilatation of the ascending aorta, measuring 37 mm.  6. The inferior vena cava is normal in size with greater than 50% respiratory variability, suggesting right atrial pressure of 3 mmHg.  EKG:   EKG 12/04/2022: Normal sinus rhythm with rate of 68 bpm, left atrial enlargement, left anterior fascicular block.  Cannot exclude inferior infarct old.  Right bundle branch block.  Bifascicular block.   Medications and allergies   Allergies  Allergen Reactions   Fluticasone Hives and Itching   Ibuprofen Other (See Comments)    Hives in high dosage    Medication list   Current Outpatient Medications:    aspirin EC 81 MG tablet, Take 81 mg by mouth daily. Swallow whole., Disp: , Rfl:    glimepiride  (AMARYL) 4 MG tablet, Take 4 mg by mouth daily with breakfast., Disp: , Rfl:    metoprolol succinate (TOPROL-XL) 50 MG 24 hr tablet, Take 50 mg by mouth daily., Disp: , Rfl:    Multiple Vitamin (MULTIVITAMIN) tablet, Take 1 tablet by mouth daily., Disp: , Rfl:    nitroGLYCERIN (NITROSTAT) 0.4 MG SL tablet, Place 0.4 mg under the tongue every 5 (five) minutes as needed., Disp: , Rfl:    pantoprazole (PROTONIX) 40 MG tablet, Take 40 mg by mouth daily., Disp: , Rfl:    ramipril (ALTACE) 10 MG capsule, Take 10 mg by mouth 2 (two) times daily., Disp: , Rfl:    rosuvastatin (CRESTOR) 40 MG tablet, Take 1 tablet (40 mg total) by mouth daily., Disp: 30 tablet, Rfl: 1   VITAMIN D PO, Take by mouth., Disp: , Rfl:   Assessment     ICD-10-CM   1. Claudication in peripheral vascular disease (HCC)  I73.9 EKG 12-Lead    PCV LOWER ARTERIAL (BILATERAL)    2. 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   Medications Discontinued During This Encounter  Medication Reason   JARDIANCE 10 MG TABS tablet    oxyCODONE-acetaminophen (PERCOCET) 5-325 MG tablet    OZEMPIC, 0.25 OR 0.5 MG/DOSE, 2 MG/3ML SOPN    sucralfate (CARAFATE) 1 g  tablet      Recommendations:   Jackson Porter is a 72 y.o.  Caucasian male patient with coronary disease, history of angioplasty and stenting to RCA, LAD and Cx in 2007, hypertension, hypercholesterolemia, diabetes mellitus, 30-pack-year history of smoking quit after his myocardial infarction 2007 referred to me for evaluation of peripheral artery disease.   1. Claudication in peripheral vascular disease [facility]) Patient referred to me for evaluation and management of peripheral artery disease and symptoms of claudication.  Patient still works 4 days a week, also likes to fish, has been having difficulty with walking  and has bilateral hip claudication and also bilateral calf claudication.  He frequently has to rest due to claudication and in fact has a handicap placard due to pain in his legs.  After discussion with him and his wife, I reviewed his CT angiogram of the abdomen, he clearly has bilateral iliac artery disease and probably may have SFA disease as well as small vessel disease below the knee in view of diabetic status.  He will certainly benefit from peripheral arteriogram and possible angioplasty.  I will obtain baseline lower extremity arterial duplex to plan the angioplasty along with ABI.  I will go ahead and schedule him for peripheral arteriogram.   Will schedule for peripheral arteriogram and possible angioplasty given symptoms. Patient understands the risks, benefits, alternatives including medical therapy, CT angiography. Patient understands <1-2% risk of death, embolic complications, bleeding, infection, renal failure, urgent surgical revascularization, but not limited to these and wants to proceed.  - EKG 12-Lead - PCV LOWER ARTERIAL (BILATERAL); Future  2. 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  He is completely quit smoking as well.  With regard to coronary artery disease, hypertension, hypercholesterolemia, I reviewed his external labs and also his medications.  He is on appropriate medical therapy he is also on high intensity high-dose Crestor as well.  His systolic blood pressure was slightly elevated, however his blood pressure at home and also elsewhere has been fairly well-controlled hence I did not make any changes to his medications and he is also on an ACE inhibitor at the maximum tolerated dose.  If I continue to see his elevated blood pressure systolic, will consider addition of a low-dose of diuretic.  Office visit after peripheral  arteriogram and possible angioplasty.  All questions answered.  Wife present at the bedside as well.  Other orders - Multiple Vitamin (MULTIVITAMIN) tablet; Take 1 tablet by mouth daily.   Yates Decamp, MD, [facility] 12/04/2022, 10:10 AM Office: [phone]

## 2022-12-11 ENCOUNTER — Ambulatory Visit: Payer: Medicare Other

## 2022-12-11 DIAGNOSIS — I739 Peripheral vascular disease, unspecified: Secondary | ICD-10-CM | POA: Diagnosis not present

## 2022-12-13 ENCOUNTER — Other Ambulatory Visit: Payer: Medicare Other

## 2022-12-13 NOTE — H&P (View-Only) (Signed)
 Lower Extremity Arterial Duplex 12/11/2022: No hemodynamically significant stenoses are identified in the bilateral lower extremity arterial system.   This exam reveals normal perfusion of the right lower extremity (ABI 0.98) with severely abnormal monophasic waveform at the PT and mildly abnormal biphasic waveform at the DP.   This exam reveals normal perfusion of the left lower extremity (ABI 1.00) with severely abnormal monophasic waveform in both DP and PT at the left ankle. 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

## 2022-12-13 NOTE — Progress Notes (Signed)
Lower Extremity Arterial Duplex 12/11/2022: No hemodynamically significant stenoses are identified in the bilateral lower extremity arterial system.   This exam reveals normal perfusion of the right lower extremity (ABI 0.98) with severely abnormal monophasic waveform at the PT and mildly abnormal biphasic waveform at the DP.   This exam reveals normal perfusion of the left lower extremity (ABI 1.00) with severely abnormal monophasic waveform in both DP and PT at the left ankle. 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

## 2022-12-25 DIAGNOSIS — N138 Other obstructive and reflux uropathy: Secondary | ICD-10-CM | POA: Diagnosis not present

## 2022-12-26 DIAGNOSIS — G4733 Obstructive sleep apnea (adult) (pediatric): Secondary | ICD-10-CM | POA: Diagnosis not present

## 2022-12-29 ENCOUNTER — Other Ambulatory Visit: Payer: Self-pay

## 2022-12-29 ENCOUNTER — Ambulatory Visit (HOSPITAL_COMMUNITY)
Admission: RE | Admit: 2022-12-29 | Discharge: 2022-12-29 | Disposition: A | Discharge status: Home / Self Care | Payer: Medicare Other | Attending: Cardiology | Admitting: Cardiology

## 2022-12-29 ENCOUNTER — Encounter (HOSPITAL_COMMUNITY)
Admission: RE | Disposition: A | Discharge status: Home / Self Care | Payer: Self-pay | Source: Home / Self Care | Attending: Cardiology

## 2022-12-29 DIAGNOSIS — I714 Abdominal aortic aneurysm, without rupture, unspecified: Secondary | ICD-10-CM | POA: Insufficient documentation

## 2022-12-29 DIAGNOSIS — I252 Old myocardial infarction: Secondary | ICD-10-CM | POA: Diagnosis not present

## 2022-12-29 DIAGNOSIS — E7800 Pure hypercholesterolemia, unspecified: Secondary | ICD-10-CM | POA: Insufficient documentation

## 2022-12-29 DIAGNOSIS — I2510 Atherosclerotic heart disease of native coronary artery without angina pectoris: Secondary | ICD-10-CM | POA: Diagnosis not present

## 2022-12-29 DIAGNOSIS — Z955 Presence of coronary angioplasty implant and graft: Secondary | ICD-10-CM | POA: Insufficient documentation

## 2022-12-29 DIAGNOSIS — E1151 Type 2 diabetes mellitus with diabetic peripheral angiopathy without gangrene: Secondary | ICD-10-CM | POA: Diagnosis not present

## 2022-12-29 DIAGNOSIS — I70213 Atherosclerosis of native arteries of extremities with intermittent claudication, bilateral legs: Secondary | ICD-10-CM | POA: Diagnosis not present

## 2022-12-29 DIAGNOSIS — Z87891 Personal history of nicotine dependence: Secondary | ICD-10-CM | POA: Diagnosis not present

## 2022-12-29 DIAGNOSIS — I70219 Atherosclerosis of native arteries of extremities with intermittent claudication, unspecified extremity: Secondary | ICD-10-CM

## 2022-12-29 DIAGNOSIS — I10 Essential (primary) hypertension: Secondary | ICD-10-CM | POA: Diagnosis not present

## 2022-12-29 HISTORY — PX: PERIPHERAL INTRAVASCULAR LITHOTRIPSY: CATH118324

## 2022-12-29 HISTORY — PX: PERIPHERAL VASCULAR BALLOON ANGIOPLASTY: CATH118281

## 2022-12-29 HISTORY — PX: ABDOMINAL AORTOGRAM W/LOWER EXTREMITY: CATH118223

## 2022-12-29 LAB — GLUCOSE, CAPILLARY: Glucose-Capillary: 122 mg/dL — ABNORMAL HIGH (ref 70–99)

## 2022-12-29 LAB — POCT I-STAT, CHEM 8
BUN: 11 mg/dL (ref 8–23)
Calcium, Ion: 1.2 mmol/L (ref 1.15–1.40)
Chloride: 106 mmol/L (ref 98–111)
Creatinine, Ser: 1.1 mg/dL (ref 0.61–1.24)
Glucose, Bld: 143 mg/dL — ABNORMAL HIGH (ref 70–99)
HCT: 45 % (ref 39.0–52.0)
Hemoglobin: 15.3 g/dL (ref 13.0–17.0)
Potassium: 5 mmol/L (ref 3.5–5.1)
Sodium: 141 mmol/L (ref 135–145)
TCO2: 25 mmol/L (ref 22–32)

## 2022-12-29 LAB — POCT ACTIVATED CLOTTING TIME
Activated Clotting Time: 250 seconds
Activated Clotting Time: 269 seconds

## 2022-12-29 SURGERY — ABDOMINAL AORTOGRAM W/LOWER EXTREMITY
Anesthesia: LOCAL

## 2022-12-29 MED ORDER — HEPARIN SODIUM (PORCINE) 1000 UNIT/ML IJ SOLN
INTRAMUSCULAR | Status: AC
  Filled 2022-12-29: qty 10

## 2022-12-29 MED ORDER — HEPARIN SODIUM (PORCINE) 1000 UNIT/ML IJ SOLN
INTRAMUSCULAR | Status: DC | PRN
  Administered 2022-12-29: 8000 [IU] via INTRAVENOUS
  Administered 2022-12-29: 2000 [IU] via INTRAVENOUS

## 2022-12-29 MED ORDER — ASPIRIN 81 MG PO CHEW
CHEWABLE_TABLET | ORAL | Status: AC
  Filled 2022-12-29: qty 1

## 2022-12-29 MED ORDER — SODIUM CHLORIDE 0.9 % WEIGHT BASED INFUSION: 1.0000 mL/kg/h | INTRAVENOUS | Status: AC

## 2022-12-29 MED ORDER — CLOPIDOGREL BISULFATE 300 MG PO TABS
600.0000 mg | ORAL_TABLET | Freq: Once | ORAL | Status: AC
  Administered 2022-12-29: 600 mg via ORAL
  Filled 2022-12-29: qty 2

## 2022-12-29 MED ORDER — LIDOCAINE HCL (PF) 1 % IJ SOLN
INTRAMUSCULAR | Status: DC | PRN
  Administered 2022-12-29: 15 mL via INTRADERMAL

## 2022-12-29 MED ORDER — MIDAZOLAM HCL 2 MG/2ML IJ SOLN
INTRAMUSCULAR | Status: DC | PRN
  Administered 2022-12-29: 2 mg via INTRAVENOUS

## 2022-12-29 MED ORDER — FENTANYL CITRATE (PF) 100 MCG/2ML IJ SOLN
INTRAMUSCULAR | Status: AC
  Filled 2022-12-29: qty 2

## 2022-12-29 MED ORDER — SODIUM CHLORIDE 0.9% FLUSH: 3.0000 mL | INTRAVENOUS | Status: DC | PRN

## 2022-12-29 MED ORDER — HYDRALAZINE HCL 20 MG/ML IJ SOLN: 5.0000 mg | INTRAMUSCULAR | Status: DC | PRN

## 2022-12-29 MED ORDER — LIDOCAINE HCL (PF) 1 % IJ SOLN
INTRAMUSCULAR | Status: AC
  Filled 2022-12-29: qty 30

## 2022-12-29 MED ORDER — SODIUM CHLORIDE 0.9 % IV SOLN: INTRAVENOUS | Status: DC

## 2022-12-29 MED ORDER — SODIUM CHLORIDE 0.9 % IV SOLN: 250.0000 mL | INTRAVENOUS | Status: DC | PRN

## 2022-12-29 MED ORDER — IODIXANOL 320 MG/ML IV SOLN
INTRAVENOUS | Status: DC | PRN
  Administered 2022-12-29: 130 mL via INTRA_ARTERIAL

## 2022-12-29 MED ORDER — SODIUM CHLORIDE 0.9% FLUSH: 3.0000 mL | Freq: Two times a day (BID) | INTRAVENOUS | Status: DC

## 2022-12-29 MED ORDER — FENTANYL CITRATE (PF) 100 MCG/2ML IJ SOLN
INTRAMUSCULAR | Status: DC | PRN
  Administered 2022-12-29 (×2): 50 ug via INTRAVENOUS

## 2022-12-29 MED ORDER — CLOPIDOGREL BISULFATE 75 MG PO TABS: 75.0000 mg | ORAL_TABLET | Freq: Every day | ORAL | 0 refills | Status: DC

## 2022-12-29 MED ORDER — NITROGLYCERIN 1 MG/10 ML FOR IR/CATH LAB
INTRA_ARTERIAL | Status: DC | PRN
  Administered 2022-12-29: 500 ug via INTRA_ARTERIAL

## 2022-12-29 MED ORDER — MIDAZOLAM HCL 2 MG/2ML IJ SOLN
INTRAMUSCULAR | Status: AC
  Filled 2022-12-29: qty 2

## 2022-12-29 MED ORDER — NITROGLYCERIN 1 MG/10 ML FOR IR/CATH LAB
INTRA_ARTERIAL | Status: AC
  Filled 2022-12-29: qty 10

## 2022-12-29 MED ORDER — ASPIRIN 81 MG PO CHEW
CHEWABLE_TABLET | ORAL | Status: DC | PRN
  Administered 2022-12-29: 81 mg via ORAL

## 2022-12-29 MED ORDER — ACETAMINOPHEN 325 MG PO TABS: 650.0000 mg | ORAL_TABLET | ORAL | Status: DC | PRN

## 2022-12-29 MED ORDER — HEPARIN (PORCINE) IN NACL 1000-0.9 UT/500ML-% IV SOLN
INTRAVENOUS | Status: DC | PRN
  Administered 2022-12-29: 1000 mL

## 2022-12-29 MED ORDER — ONDANSETRON HCL 4 MG/2ML IJ SOLN: 4.0000 mg | Freq: Four times a day (QID) | INTRAMUSCULAR | Status: DC | PRN

## 2022-12-29 SURGICAL SUPPLY — 25 items
BAG SNAP BAND KOVER 36X36 (MISCELLANEOUS) IMPLANT
CATH ANGIO 5F PIGTAIL 65CM (CATHETERS) IMPLANT
CATH CROSS OVER TEMPO 5F (CATHETERS) IMPLANT
CATH SHOCKWAVE M5 6.0X60 (CATHETERS) IMPLANT
CATH STRAIGHT 5FR 65CM (CATHETERS) IMPLANT
CATH TEMPO AQUA 5F 100CM (CATHETERS) IMPLANT
CLOSURE PERCLOSE PROSTYLE (VASCULAR PRODUCTS) IMPLANT
COVER DOME SNAP 22 D (MISCELLANEOUS) IMPLANT
DCB RANGER 6.0X100 135 (BALLOONS) IMPLANT
GUIDEWIRE ANGLED .035X150CM (WIRE) IMPLANT
GUIDEWIRE NITREX 0.018X80X5 (WIRE) IMPLANT
KIT ENCORE 26 ADVANTAGE (KITS) IMPLANT
KIT MICROPUNCTURE NIT STIFF (SHEATH) IMPLANT
KIT SYRINGE INJ CVI SPIKEX1 (MISCELLANEOUS) IMPLANT
PROTECTION STATION PRESSURIZED (MISCELLANEOUS) ×2
RANGER DCB 6.0X100 135 (BALLOONS) ×2
SET ATX-X65L (MISCELLANEOUS) IMPLANT
SHEATH CATAPULT 6FR 60 (SHEATH) IMPLANT
SHEATH PINNACLE 5F 10CM (SHEATH) IMPLANT
SHEATH PROBE COVER 6X72 (BAG) IMPLANT
STATION PROTECTION PRESSURIZED (MISCELLANEOUS) IMPLANT
TRAY PV CATH (CUSTOM PROCEDURE TRAY) ×2 IMPLANT
WIRE HITORQ VERSACORE ST 145CM (WIRE) IMPLANT
WIRE SPARTACORE .014X300CM (WIRE) IMPLANT
WIRE VERSACORE LOC 115CM (WIRE) IMPLANT

## 2022-12-29 NOTE — Interval H&P Note (Signed)
History and Physical Interval Note:  12/29/2022 9:48 AM  Jackson Porter  has presented today for surgery, with the diagnosis of claudication.  The various methods of treatment have been discussed with the patient and family. After consideration of risks, benefits and other options for treatment, the patient has consented to  Procedure(s): ABDOMINAL AORTOGRAM W/LOWER EXTREMITY (N/A) PERIPHERAL INTRAVASCULAR LITHOTRIPSY PERIPHERAL VASCULAR BALLOON ANGIOPLASTY as a surgical intervention.  The patient's history has been reviewed, patient examined, no change in status, stable for surgery.  I have reviewed the patient's chart and labs.  Questions were answered to the patient's satisfaction.     Yates Decamp

## 2022-12-29 NOTE — Progress Notes (Signed)
Patient and wife was given discharge instructions. Both verbalized understanding. 

## 2022-12-31 ENCOUNTER — Encounter (HOSPITAL_COMMUNITY): Payer: Self-pay | Admitting: Cardiology

## 2022-12-31 ENCOUNTER — Other Ambulatory Visit: Payer: Self-pay | Admitting: Cardiology

## 2022-12-31 DIAGNOSIS — I70219 Atherosclerosis of native arteries of extremities with intermittent claudication, unspecified extremity: Secondary | ICD-10-CM

## 2023-01-12 ENCOUNTER — Other Ambulatory Visit: Payer: Medicare Other

## 2023-01-16 ENCOUNTER — Ambulatory Visit: Payer: Medicare Other | Admitting: Cardiology

## 2023-01-22 ENCOUNTER — Telehealth: Payer: Self-pay

## 2023-01-22 NOTE — Telephone Encounter (Signed)
Im not sure what happen but he said to cancel all his appointments and he's not coming back. I dont know if you want to reach out to see if anything is wrong. Cause he just hung up

## 2023-01-22 NOTE — Telephone Encounter (Signed)
Left and right was not bad

## 2023-01-22 NOTE — Telephone Encounter (Signed)
Well, he can certainly do that. It is his right. I will try to reach out to him. Devonna, can you please reach out to see if there was a problem

## 2023-01-22 NOTE — Telephone Encounter (Signed)
Patient states he had a surgery done with you and he wanted to know if you put stents in both legs or just one

## 2023-02-05 DIAGNOSIS — J029 Acute pharyngitis, unspecified: Secondary | ICD-10-CM | POA: Diagnosis not present

## 2023-02-05 DIAGNOSIS — J069 Acute upper respiratory infection, unspecified: Secondary | ICD-10-CM | POA: Diagnosis not present

## 2023-02-05 DIAGNOSIS — Z0130 Encounter for examination of blood pressure without abnormal findings: Secondary | ICD-10-CM | POA: Diagnosis not present

## 2023-02-05 DIAGNOSIS — B9689 Other specified bacterial agents as the cause of diseases classified elsewhere: Secondary | ICD-10-CM | POA: Diagnosis not present

## 2023-02-05 DIAGNOSIS — Z1152 Encounter for screening for COVID-19: Secondary | ICD-10-CM | POA: Diagnosis not present

## 2023-02-07 ENCOUNTER — Encounter: Payer: Self-pay | Admitting: Cardiology

## 2023-02-13 ENCOUNTER — Telehealth: Payer: Self-pay

## 2023-02-13 NOTE — Telephone Encounter (Signed)
Pt's Wife called requesting that his PV Cath report be faxed to his PCP. I have faxed this report to his PCP.

## 2023-02-19 ENCOUNTER — Encounter (HOSPITAL_COMMUNITY): Payer: Medicare Other

## 2023-02-19 ENCOUNTER — Other Ambulatory Visit: Payer: Medicare Other

## 2023-02-26 ENCOUNTER — Ambulatory Visit (HOSPITAL_COMMUNITY)
Admission: RE | Admit: 2023-02-26 | Discharge: 2023-02-26 | Disposition: A | Discharge status: Home / Self Care | Payer: Medicare Other | Source: Ambulatory Visit | Attending: Cardiology | Admitting: Cardiology

## 2023-02-26 ENCOUNTER — Ambulatory Visit: Payer: Self-pay | Admitting: Cardiology

## 2023-02-26 DIAGNOSIS — I70213 Atherosclerosis of native arteries of extremities with intermittent claudication, bilateral legs: Secondary | ICD-10-CM | POA: Diagnosis not present

## 2023-02-26 DIAGNOSIS — I70219 Atherosclerosis of native arteries of extremities with intermittent claudication, unspecified extremity: Secondary | ICD-10-CM | POA: Insufficient documentation

## 2023-02-26 NOTE — Progress Notes (Signed)
Stable ABI

## 2023-02-28 LAB — VAS US ABI WITH/WO TBI
Left ABI: 1.03
Right ABI: 0.96

## 2023-03-05 ENCOUNTER — Telehealth: Payer: Self-pay | Admitting: Cardiology

## 2023-03-05 DIAGNOSIS — E782 Mixed hyperlipidemia: Secondary | ICD-10-CM | POA: Diagnosis not present

## 2023-03-05 DIAGNOSIS — E119 Type 2 diabetes mellitus without complications: Secondary | ICD-10-CM | POA: Diagnosis not present

## 2023-03-05 DIAGNOSIS — I25118 Atherosclerotic heart disease of native coronary artery with other forms of angina pectoris: Secondary | ICD-10-CM | POA: Diagnosis not present

## 2023-03-05 DIAGNOSIS — I10 Essential (primary) hypertension: Secondary | ICD-10-CM | POA: Diagnosis not present

## 2023-03-05 NOTE — Telephone Encounter (Signed)
Wife is calling in regards to patient results. Please advise

## 2023-03-05 NOTE — Telephone Encounter (Signed)
Spoke with patient he is aware of VAS [place] results. Verbalized ubderstanding

## 2023-03-15 ENCOUNTER — Telehealth: Payer: Self-pay | Admitting: Cardiology

## 2023-03-15 NOTE — Telephone Encounter (Signed)
Follow Up:       Wife is calling to see if patient ultrasound results are ready please?

## 2023-03-15 NOTE — Telephone Encounter (Signed)
Spoke with wife per DPR and she is aware patient received VAS [place] results last week. I gave wife results. She verbalized understanding.

## 2023-03-19 DIAGNOSIS — N138 Other obstructive and reflux uropathy: Secondary | ICD-10-CM | POA: Diagnosis not present

## 2023-04-16 DIAGNOSIS — M1711 Unilateral primary osteoarthritis, right knee: Secondary | ICD-10-CM | POA: Diagnosis not present

## 2023-04-16 DIAGNOSIS — M545 Low back pain, unspecified: Secondary | ICD-10-CM | POA: Diagnosis not present

## 2023-04-16 DIAGNOSIS — M25561 Pain in right knee: Secondary | ICD-10-CM | POA: Diagnosis not present

## 2023-05-07 DIAGNOSIS — H2513 Age-related nuclear cataract, bilateral: Secondary | ICD-10-CM | POA: Diagnosis not present

## 2023-05-07 DIAGNOSIS — E119 Type 2 diabetes mellitus without complications: Secondary | ICD-10-CM | POA: Diagnosis not present

## 2023-05-07 DIAGNOSIS — H1789 Other corneal scars and opacities: Secondary | ICD-10-CM | POA: Diagnosis not present

## 2023-05-07 DIAGNOSIS — H524 Presbyopia: Secondary | ICD-10-CM | POA: Diagnosis not present

## 2023-05-07 DIAGNOSIS — H353112 Nonexudative age-related macular degeneration, right eye, intermediate dry stage: Secondary | ICD-10-CM | POA: Diagnosis not present

## 2023-05-07 DIAGNOSIS — H52203 Unspecified astigmatism, bilateral: Secondary | ICD-10-CM | POA: Diagnosis not present

## 2023-05-07 DIAGNOSIS — H04123 Dry eye syndrome of bilateral lacrimal glands: Secondary | ICD-10-CM | POA: Diagnosis not present

## 2023-05-24 DIAGNOSIS — E119 Type 2 diabetes mellitus without complications: Secondary | ICD-10-CM | POA: Diagnosis not present

## 2023-05-24 DIAGNOSIS — I10 Essential (primary) hypertension: Secondary | ICD-10-CM | POA: Diagnosis not present

## 2023-05-24 DIAGNOSIS — I739 Peripheral vascular disease, unspecified: Secondary | ICD-10-CM | POA: Diagnosis not present

## 2023-05-28 ENCOUNTER — Other Ambulatory Visit: Payer: Self-pay | Admitting: Family Medicine

## 2023-05-28 DIAGNOSIS — G4733 Obstructive sleep apnea (adult) (pediatric): Secondary | ICD-10-CM | POA: Diagnosis not present

## 2023-05-28 DIAGNOSIS — R229 Localized swelling, mass and lump, unspecified: Secondary | ICD-10-CM | POA: Diagnosis not present

## 2023-05-28 DIAGNOSIS — I252 Old myocardial infarction: Secondary | ICD-10-CM | POA: Diagnosis not present

## 2023-05-28 DIAGNOSIS — E785 Hyperlipidemia, unspecified: Secondary | ICD-10-CM | POA: Diagnosis not present

## 2023-05-28 DIAGNOSIS — I10 Essential (primary) hypertension: Secondary | ICD-10-CM | POA: Diagnosis not present

## 2023-05-28 DIAGNOSIS — I2510 Atherosclerotic heart disease of native coronary artery without angina pectoris: Secondary | ICD-10-CM | POA: Diagnosis not present

## 2023-05-28 DIAGNOSIS — Z0000 Encounter for general adult medical examination without abnormal findings: Secondary | ICD-10-CM | POA: Diagnosis not present

## 2023-05-28 DIAGNOSIS — R911 Solitary pulmonary nodule: Secondary | ICD-10-CM | POA: Diagnosis not present

## 2023-05-28 DIAGNOSIS — I700 Atherosclerosis of aorta: Secondary | ICD-10-CM | POA: Diagnosis not present

## 2023-05-28 DIAGNOSIS — E119 Type 2 diabetes mellitus without complications: Secondary | ICD-10-CM | POA: Diagnosis not present

## 2023-05-28 DIAGNOSIS — I714 Abdominal aortic aneurysm, without rupture, unspecified: Secondary | ICD-10-CM | POA: Diagnosis not present

## 2023-06-01 DIAGNOSIS — L570 Actinic keratosis: Secondary | ICD-10-CM | POA: Diagnosis not present

## 2023-06-01 DIAGNOSIS — C44319 Basal cell carcinoma of skin of other parts of face: Secondary | ICD-10-CM | POA: Diagnosis not present

## 2023-06-01 DIAGNOSIS — X32XXXA Exposure to sunlight, initial encounter: Secondary | ICD-10-CM | POA: Diagnosis not present

## 2023-06-04 DIAGNOSIS — I10 Essential (primary) hypertension: Secondary | ICD-10-CM | POA: Diagnosis not present

## 2023-06-04 DIAGNOSIS — I2510 Atherosclerotic heart disease of native coronary artery without angina pectoris: Secondary | ICD-10-CM | POA: Diagnosis not present

## 2023-06-04 DIAGNOSIS — E119 Type 2 diabetes mellitus without complications: Secondary | ICD-10-CM | POA: Diagnosis not present

## 2023-06-04 DIAGNOSIS — E782 Mixed hyperlipidemia: Secondary | ICD-10-CM | POA: Diagnosis not present

## 2023-06-18 ENCOUNTER — Other Ambulatory Visit: Payer: Medicare Other

## 2023-06-25 ENCOUNTER — Telehealth: Payer: Self-pay | Admitting: Cardiology

## 2023-06-25 NOTE — Telephone Encounter (Signed)
 Wife is calling to say the patient would like to switch providers. Please advise

## 2023-06-25 NOTE — Telephone Encounter (Signed)
 I am fine with who he wants to see.

## 2023-06-26 NOTE — Telephone Encounter (Signed)
Jackson Porter

## 2023-07-06 DIAGNOSIS — H04123 Dry eye syndrome of bilateral lacrimal glands: Secondary | ICD-10-CM | POA: Diagnosis not present

## 2023-07-06 DIAGNOSIS — H2512 Age-related nuclear cataract, left eye: Secondary | ICD-10-CM | POA: Diagnosis not present

## 2023-07-06 DIAGNOSIS — H1789 Other corneal scars and opacities: Secondary | ICD-10-CM | POA: Diagnosis not present

## 2023-07-13 DIAGNOSIS — Z08 Encounter for follow-up examination after completed treatment for malignant neoplasm: Secondary | ICD-10-CM | POA: Diagnosis not present

## 2023-07-13 DIAGNOSIS — Z85828 Personal history of other malignant neoplasm of skin: Secondary | ICD-10-CM | POA: Diagnosis not present

## 2023-07-19 DIAGNOSIS — R310 Gross hematuria: Secondary | ICD-10-CM | POA: Diagnosis not present

## 2023-07-20 ENCOUNTER — Ambulatory Visit
Admission: RE | Admit: 2023-07-20 | Discharge: 2023-07-20 | Disposition: A | Discharge status: Home / Self Care | Payer: Medicare Other | Source: Ambulatory Visit | Attending: Family Medicine | Admitting: Family Medicine

## 2023-07-20 DIAGNOSIS — I2510 Atherosclerotic heart disease of native coronary artery without angina pectoris: Secondary | ICD-10-CM | POA: Diagnosis not present

## 2023-07-20 DIAGNOSIS — I700 Atherosclerosis of aorta: Secondary | ICD-10-CM | POA: Diagnosis not present

## 2023-07-20 DIAGNOSIS — R911 Solitary pulmonary nodule: Secondary | ICD-10-CM

## 2023-07-26 DIAGNOSIS — R310 Gross hematuria: Secondary | ICD-10-CM | POA: Diagnosis not present

## 2023-08-06 DIAGNOSIS — R310 Gross hematuria: Secondary | ICD-10-CM | POA: Diagnosis not present

## 2023-08-15 DIAGNOSIS — L570 Actinic keratosis: Secondary | ICD-10-CM | POA: Diagnosis not present

## 2023-08-15 DIAGNOSIS — L821 Other seborrheic keratosis: Secondary | ICD-10-CM | POA: Diagnosis not present

## 2023-08-15 DIAGNOSIS — X32XXXD Exposure to sunlight, subsequent encounter: Secondary | ICD-10-CM | POA: Diagnosis not present

## 2023-08-19 NOTE — Progress Notes (Unsigned)
 Cardiology Office Note:    Date:  08/22/2023   ID:  Jackson Porter, DOB Jun 29, 1950, MRN [id number]  PCP:  Irven Coe, MD  Cardiologist:  None  Electrophysiologist:  None   Referring MD: Irven Coe, MD   Chief Complaint  Patient presents with   Coronary Artery Disease    History of Present Illness:    Jackson Porter is a 73 y.o. male with a hx of CAD status post stenting to RCA, LAD and LCx in 2007, T2DM, former tobacco use, hypertension, hyperlipidemia who presents for follow-up.  He previously followed with Dr. Sharyn Lull and Dr Jacinto Halim, last seen 02/2023.  He denies any chest pain, dyspnea, lower extremity edema, or palpitations.  Reports exercise limited by knee pain but he walks 1 to 1.5 miles on treadmill about twice per week.  Does report he been having some lightheadedness but denies any syncope.  He reports having some issues with hematuria recently and went to urology.  He stopped Jardiance and symptoms resolved.     Past Medical History:  Diagnosis Date   Coronary artery disease    Diabetes mellitus without complication (HCC)    Myocardial infarction [facility])     Past Surgical History:  Procedure Laterality Date   ABDOMINAL AORTOGRAM W/LOWER EXTREMITY N/A 12/29/2022   Procedure: ABDOMINAL AORTOGRAM W/LOWER EXTREMITY;  Surgeon: Yates Decamp, MD;  Location: [facility];  Service: Cardiovascular;  Laterality: N/A;   APPENDECTOMY     CHOLECYSTECTOMY N/A 01/02/2022   Procedure: LAPAROSCOPIC CHOLECYSTECTOMY;  Surgeon: [name], MD;  Location: [facility] OR[facility];  Service: General;  Laterality: N/A;   CORONARY STENT PLACEMENT     x3   HERNIA REPAIR Right    PERIPHERAL INTRAVASCULAR LITHOTRIPSY  12/29/2022   Procedure: PERIPHERAL INTRAVASCULAR LITHOTRIPSY;  Surgeon: Yates Decamp, MD;  Location: [facility];  Service: Cardiovascular;;   PERIPHERAL VASCULAR BALLOON ANGIOPLASTY  12/29/2022   Procedure: PERIPHERAL VASCULAR BALLOON ANGIOPLASTY;  Surgeon: Yates Decamp, MD;   Location: [facility];  Service: Cardiovascular;;   TONSILLECTOMY      Current Medications: Current Meds  Medication Sig   aspirin EC 81 MG tablet Take 81 mg by mouth daily. Swallow whole.   metoprolol succinate (TOPROL-XL) 50 MG 24 hr tablet Take 50 mg by mouth daily.   Multiple Vitamin (MULTIVITAMIN) tablet Take 1 tablet by mouth daily. Mega men Energy pack from [facility]   nitroGLYCERIN (NITROSTAT) 0.4 MG SL tablet Place 0.4 mg under the tongue every 5 (five) minutes as needed.   OZEMPIC, 0.25 OR 0.5 MG/DOSE, 2 MG/3ML SOPN Inject 0.25 mg into the skin once a week.   pantoprazole (PROTONIX) 40 MG tablet Take 40 mg by mouth daily.   ramipril (ALTACE) 10 MG capsule Take 10 mg by mouth 2 (two) times daily.   rosuvastatin (CRESTOR) 40 MG tablet Take 1 tablet (40 mg total) by mouth daily.   [DISCONTINUED] clopidogrel (PLAVIX) 75 MG tablet Take 1 tablet (75 mg total) by mouth daily.     Allergies:   Fluticasone and Ibuprofen   Social History   Socioeconomic History   Marital status: Married    Spouse name: Not on file   Number of children: 1   Years of education: Not on file   Highest education level: Not on file  Occupational History   Not on file  Tobacco Use   Smoking status: Former    Current packs/day: 1.50    Average packs/day: 1.1 packs/day for 60.6 years (69.3  ttl pk-yrs)    Types: Cigarettes    Start date: 12/27/1962   Smokeless tobacco: Never  Vaping Use   Vaping status: Not on file  Substance and Sexual Activity   Alcohol use: No   Drug use: No   Sexual activity: Not on file  Other Topics Concern   Not on file  Social History Narrative   Not on file   Social Drivers of Health   Financial Resource Strain: Not on file  Food Insecurity: No Food Insecurity (10/22/2022)   Hunger Vital Sign    Worried About Running Out of Food in the Last Year: Never true    Ran Out of Food in the Last Year: Never true  Transportation Needs: No Transportation Needs (10/22/2022)    PRAPARE - Administrator, Civil Service (Medical): No    Lack of Transportation (Non-Medical): No  Physical Activity: Not on file  Stress: Not on file  Social Connections: Unknown (09/23/2021)   [facility]   Social Network    Social Network: Not on file     Family History: The patient's family history includes Heart attack in his brother.  ROS:   Please see the history of present illness.     All other systems reviewed and are negative.  EKGs/Labs/Other Studies Reviewed:    The following studies were reviewed today:   EKG:   08/21/2023: Normal sinus rhythm, right bundle branch block, left anterior fascicular block, inferior Q waves  Recent Labs: 10/23/2022: Magnesium 2.1; Platelets 133 12/29/2022: BUN 11; Creatinine, Ser 1.10; Hemoglobin 15.3; Potassium 5.0; Sodium 141  Recent Lipid Panel No results found for: "CHOL", "TRIG", "HDL", "CHOLHDL", "VLDL", "LDLCALC", "LDLDIRECT"  Physical Exam:    VS:  BP 138/80   Pulse 77   Ht 5\' 8"  (1.727 m)   Wt 213 lb 3.2 oz (96.7 kg)   SpO2 97%   BMI 32.42 kg/m     Wt Readings from Last 3 Encounters:  08/21/23 213 lb 3.2 oz (96.7 kg)  12/29/22 205 lb (93 kg)  12/04/22 206 lb (93.4 kg)     GEN:  Well nourished, well developed in no acute distress HEENT: Normal NECK: No JVD; No carotid bruits LYMPHATICS: No lymphadenopathy CARDIAC: RRR, no murmurs, rubs, gallops RESPIRATORY:  Clear to auscultation without rales, wheezing or rhonchi  ABDOMEN: Soft, non-tender, non-distended MUSCULOSKELETAL:  No edema; No deformity  SKIN: Warm and dry NEUROLOGIC:  Alert and oriented x 3 PSYCHIATRIC:  Normal affect   ASSESSMENT:    1. 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 CAD in native artery   3. Infrarenal abdominal aortic aneurysm (AAA) without rupture (HCC)   4. Mitral annular calcification   5. Essential hypertension   6. Hyperlipidemia, unspecified  hyperlipidemia type    PLAN:    CAD: status post stenting to RCA, LAD and LCx in 2007.  Denies anginal symptoms - Continue aspirin 81 mg daily.  Can discontinue Plavix - Continue rosuvastatin 40 mg daily - Update echocardiogram  PAD: Underwent peripheral angiography 12/2022 which showed 60% stenosis proximal right CIA, 40% distal left CIA, 80% left SFA, 50% right SFA, 100% left ATA, 100% right ATA; underwent angioplasty to left SFA.  ABIs 02/2023 were normal (right 0.96, left 1.03) - Continue aspirin.  Okay to discontinue Plavix as has been over 6 months since his angioplasty - Continue statin - Repeat ABIs in 02/2024  AAA: Infrarenal abdominal aortic aneurysm measuring 3.3 cm  on CT abdomen 10/2022.  Follow-up ultrasound recommended in 3 years.  Mitral annular calcifications: Significant MAC noted on recent CT chest.  Check echocardiogram to evaluate for mitral valve disease  Hypertension: On ramipril 10 mg twice daily  Hyperlipidemia: On rosuvastatin 40 mg daily.  LDL 38 on 05/24/2023  T2DM: On Ozempic.  A1c 6.9% on 05/24/2023  OSA: reports inability to tolerate CPAP, follows with sleep medicine  RTC in 6 months   Medication Adjustments/Labs and Tests Ordered: Current medicines are reviewed at length with the patient today.  Concerns regarding medicines are outlined above.  Orders Placed This Encounter  Procedures   EKG 12-Lead   ECHOCARDIOGRAM COMPLETE   VAS [place] ABI WITH/WO TBI   VAS [place] LOWER EXTREMITY ARTERIAL DUPLEX   No orders of the defined types were placed in this encounter.   Patient Instructions  Medication Instructions:  IF YOU ARE CURRENTLY TAKING PLAVIX (CLOPIDOGREL) STOP TAKING   CALL OFFICE WITH MEDICATIONS YOU ARE CURRENTLY TAKING    *If you need a refill on your cardiac medications before your next appointment, please call your pharmacy*  Lab Work: NONE If you have labs (blood work) drawn today and your tests are completely normal, you will receive your  results only by: MyChart Message (if you have MyChart) OR A paper copy in the mail If you have any lab test that is abnormal or we need to change your treatment, we will call you to review the results.  Testing/Procedures: Your physician has requested that you have an echocardiogram. Echocardiography is a painless test that uses sound waves to create images of your heart. It provides your doctor with information about the size and shape of your heart and how well your heart's chambers and valves are working. This procedure takes approximately one hour. There are no restrictions for this procedure. Please do NOT wear cologne, perfume, aftershave, or lotions (deodorant is allowed). Please arrive 15 minutes prior to your appointment time.  Please note: We ask at that you not bring children with you during ultrasound (echo/ vascular) testing. Due to room size and safety concerns, children are not allowed in the ultrasound rooms during exams. Our front office staff cannot provide observation of children in our lobby area while testing is being conducted. An adult accompanying a patient to their appointment will only be allowed in the ultrasound room at the discretion of the ultrasound technician under special circumstances. We apologize for any inconvenience. SOON  Your physician has requested that you have an ankle brachial index (ABI). During this test an ultrasound and blood pressure cuff are used to evaluate the arteries that supply the arms and legs with blood. Allow thirty minutes for this exam. There are no restrictions or special instructions.  Please note: We ask at that you not bring children with you during ultrasound (echo/ vascular) testing. Due to room size and safety concerns, children are not allowed in the ultrasound rooms during exams. Our front office staff cannot provide observation of children in our lobby area while testing is being conducted. An adult accompanying a patient to their  appointment will only be allowed in the ultrasound room at the discretion of the ultrasound technician under special circumstances. We apologize for any inconvenience. ABOUT A WEEK PRIOR TO FOLLOW UP IN 6 MONTH   Follow-Up: At [facility], you and your health needs are our priority.  As part of our continuing mission to provide you with exceptional heart care, our providers are all part  of one team.  This team includes your primary Cardiologist (physician) and Advanced Practice Providers or APPs (Physician Assistants and Nurse Practitioners) who all work together to provide you with the care you need, when you need it.  Your next appointment:   6 month(s)  Provider:   DR Bjorn Pippin   We recommend signing up for the patient portal called "MyChart".  Sign up information is provided on this After Visit Summary.  MyChart is used to connect with patients for Virtual Visits (Telemedicine).  Patients are able to view lab/test results, encounter notes, upcoming appointments, etc.  Non-urgent messages can be sent to your provider as well.   To learn more about what you can do with MyChart, go to [email or web address].         Signed, Little Ishikawa, MD  08/22/2023 12:04 AM    [facility]

## 2023-08-21 ENCOUNTER — Ambulatory Visit (HOSPITAL_BASED_OUTPATIENT_CLINIC_OR_DEPARTMENT_OTHER): Admitting: Cardiology

## 2023-08-21 ENCOUNTER — Encounter (HOSPITAL_BASED_OUTPATIENT_CLINIC_OR_DEPARTMENT_OTHER): Payer: Self-pay | Admitting: Cardiology

## 2023-08-21 VITALS — BP 138/80 | HR 77 | Ht 68.0 in | Wt 213.2 lb

## 2023-08-21 DIAGNOSIS — I70219 Atherosclerosis of native arteries of extremities with intermittent claudication, unspecified extremity: Secondary | ICD-10-CM | POA: Diagnosis not present

## 2023-08-21 DIAGNOSIS — I3481 Nonrheumatic mitral (valve) annulus calcification: Secondary | ICD-10-CM

## 2023-08-21 DIAGNOSIS — I10 Essential (primary) hypertension: Secondary | ICD-10-CM | POA: Diagnosis not present

## 2023-08-21 DIAGNOSIS — E785 Hyperlipidemia, unspecified: Secondary | ICD-10-CM

## 2023-08-21 DIAGNOSIS — I7143 Infrarenal abdominal aortic aneurysm, without rupture: Secondary | ICD-10-CM | POA: Diagnosis not present

## 2023-08-21 DIAGNOSIS — I2510 Atherosclerotic heart disease of native coronary artery without angina pectoris: Secondary | ICD-10-CM | POA: Diagnosis not present

## 2023-08-21 NOTE — Patient Instructions (Signed)
 Medication Instructions:  IF YOU ARE CURRENTLY TAKING PLAVIX (CLOPIDOGREL) STOP TAKING   CALL OFFICE WITH MEDICATIONS YOU ARE CURRENTLY TAKING    *If you need a refill on your cardiac medications before your next appointment, please call your pharmacy*  Lab Work: NONE If you have labs (blood work) drawn today and your tests are completely normal, you will receive your results only by: MyChart Message (if you have MyChart) OR A paper copy in the mail If you have any lab test that is abnormal or we need to change your treatment, we will call you to review the results.  Testing/Procedures: Your physician has requested that you have an echocardiogram. Echocardiography is a painless test that uses sound waves to create images of your heart. It provides your doctor with information about the size and shape of your heart and how well your heart's chambers and valves are working. This procedure takes approximately one hour. There are no restrictions for this procedure. Please do NOT wear cologne, perfume, aftershave, or lotions (deodorant is allowed). Please arrive 15 minutes prior to your appointment time.  Please note: We ask at that you not bring children with you during ultrasound (echo/ vascular) testing. Due to room size and safety concerns, children are not allowed in the ultrasound rooms during exams. Our front office staff cannot provide observation of children in our lobby area while testing is being conducted. An adult accompanying a patient to their appointment will only be allowed in the ultrasound room at the discretion of the ultrasound technician under special circumstances. We apologize for any inconvenience. SOON  Your physician has requested that you have an ankle brachial index (ABI). During this test an ultrasound and blood pressure cuff are used to evaluate the arteries that supply the arms and legs with blood. Allow thirty minutes for this exam. There are no restrictions or  special instructions.  Please note: We ask at that you not bring children with you during ultrasound (echo/ vascular) testing. Due to room size and safety concerns, children are not allowed in the ultrasound rooms during exams. Our front office staff cannot provide observation of children in our lobby area while testing is being conducted. An adult accompanying a patient to their appointment will only be allowed in the ultrasound room at the discretion of the ultrasound technician under special circumstances. We apologize for any inconvenience. ABOUT A WEEK PRIOR TO FOLLOW UP IN 6 MONTH   Follow-Up: At [facility], you and your health needs are our priority.  As part of our continuing mission to provide you with exceptional heart care, our providers are all part of one team.  This team includes your primary Cardiologist (physician) and Advanced Practice Providers or APPs (Physician Assistants and Nurse Practitioners) who all work together to provide you with the care you need, when you need it.  Your next appointment:   6 month(s)  Provider:   DR Bjorn Pippin   We recommend signing up for the patient portal called "MyChart".  Sign up information is provided on this After Visit Summary.  MyChart is used to connect with patients for Virtual Visits (Telemedicine).  Patients are able to view lab/test results, encounter notes, upcoming appointments, etc.  Non-urgent messages can be sent to your provider as well.   To learn more about what you can do with MyChart, go to [email or web address].

## 2023-09-26 ENCOUNTER — Ambulatory Visit (HOSPITAL_BASED_OUTPATIENT_CLINIC_OR_DEPARTMENT_OTHER)

## 2023-09-26 DIAGNOSIS — I2510 Atherosclerotic heart disease of native coronary artery without angina pectoris: Secondary | ICD-10-CM | POA: Diagnosis not present

## 2023-09-26 DIAGNOSIS — I70219 Atherosclerosis of native arteries of extremities with intermittent claudication, unspecified extremity: Secondary | ICD-10-CM

## 2023-09-26 DIAGNOSIS — I70213 Atherosclerosis of native arteries of extremities with intermittent claudication, bilateral legs: Secondary | ICD-10-CM

## 2023-09-26 LAB — ECHOCARDIOGRAM COMPLETE
Area-P 1/2: 3.21 cm2
S' Lateral: 2.12 cm

## 2023-09-27 ENCOUNTER — Ambulatory Visit: Payer: Self-pay | Admitting: Cardiology

## 2023-10-04 DIAGNOSIS — E1165 Type 2 diabetes mellitus with hyperglycemia: Secondary | ICD-10-CM | POA: Diagnosis not present

## 2023-10-05 ENCOUNTER — Telehealth: Payer: Self-pay | Admitting: *Deleted

## 2023-10-05 MED ORDER — METOPROLOL SUCCINATE ER 50 MG PO TB24: 50.0000 mg | ORAL_TABLET | Freq: Every day | ORAL | 0 refills | Status: DC

## 2023-10-05 MED ORDER — RAMIPRIL 10 MG PO CAPS: 10.0000 mg | ORAL_CAPSULE | Freq: Two times a day (BID) | ORAL | 0 refills | Status: DC

## 2023-10-05 NOTE — Telephone Encounter (Signed)
 Called and spoke to patient and spouse. Per Dr. Alda Amas medication metoprolol  and Rosuvastatin  ordered. Made patient aware to call office for any questions.

## 2023-10-17 DIAGNOSIS — R059 Cough, unspecified: Secondary | ICD-10-CM | POA: Diagnosis not present

## 2023-10-17 DIAGNOSIS — Z0000 Encounter for general adult medical examination without abnormal findings: Secondary | ICD-10-CM | POA: Diagnosis not present

## 2023-10-17 DIAGNOSIS — J018 Other acute sinusitis: Secondary | ICD-10-CM | POA: Diagnosis not present

## 2023-10-29 DIAGNOSIS — Z7289 Other problems related to lifestyle: Secondary | ICD-10-CM | POA: Diagnosis not present

## 2023-10-29 DIAGNOSIS — E119 Type 2 diabetes mellitus without complications: Secondary | ICD-10-CM | POA: Diagnosis not present

## 2023-10-29 DIAGNOSIS — R0602 Shortness of breath: Secondary | ICD-10-CM | POA: Diagnosis not present

## 2023-10-29 DIAGNOSIS — Z1159 Encounter for screening for other viral diseases: Secondary | ICD-10-CM | POA: Diagnosis not present

## 2023-11-16 DIAGNOSIS — H1132 Conjunctival hemorrhage, left eye: Secondary | ICD-10-CM | POA: Diagnosis not present

## 2023-11-19 DIAGNOSIS — N138 Other obstructive and reflux uropathy: Secondary | ICD-10-CM | POA: Diagnosis not present

## 2023-11-26 ENCOUNTER — Telehealth (HOSPITAL_BASED_OUTPATIENT_CLINIC_OR_DEPARTMENT_OTHER): Payer: Self-pay | Admitting: Cardiology

## 2023-11-26 NOTE — Telephone Encounter (Signed)
 Patient came to [facility] front desk asking for Dr Kate to take over prescribing his rosuvastatin  and pantoprazole . Apparently patient's pharmacy is sending refill requests to his former cardiologist. Dr Alvan name is attached to metoprolol  and ramipril  already and patient will need the ramipril  and pantoprazole  refilled soon. Explained that it might not be appropriate for Dr Kate to refill the pantoprazole  but patient does not have a PCP or GI dr at this time. Please call the patient to discuss.

## 2023-11-26 NOTE — Telephone Encounter (Signed)
 Called patient and wife and per patient and wife no refills are needed at this  time. Made patient and spouse aware that Dr. Kate is out of office, but please call office when refills are needed. Made patient and spouse aware that pantoprazole  may need to be refill by PCP. Made patient and spouse aware that Dr. Kate will be made aware. Understanding verbalized.

## 2023-12-31 DIAGNOSIS — M1711 Unilateral primary osteoarthritis, right knee: Secondary | ICD-10-CM | POA: Diagnosis not present

## 2024-01-03 ENCOUNTER — Telehealth: Payer: Self-pay | Admitting: Cardiology

## 2024-01-03 MED ORDER — METOPROLOL SUCCINATE ER 50 MG PO TB24: 50.0000 mg | ORAL_TABLET | Freq: Every day | ORAL | 2 refills | Status: DC

## 2024-01-03 NOTE — Telephone Encounter (Signed)
 RX sent in

## 2024-01-03 NOTE — Telephone Encounter (Signed)
*  STAT* If patient is at the pharmacy, call can be transferred to refill team.   1. Which medications need to be refilled? (please list name of each medication and dose if known)  metoprolol  succinate (TOPROL -XL) 50 MG 24 hr tablet     2. Which pharmacy/location (including street and city if local pharmacy) is medication to be sent to? [id number] - [place], [place] - [address]    3. Do they need a 30 day or 90 day supply?  90 day supply

## 2024-01-28 DIAGNOSIS — M25561 Pain in right knee: Secondary | ICD-10-CM | POA: Diagnosis not present

## 2024-02-04 ENCOUNTER — Telehealth: Payer: Self-pay | Admitting: Cardiology

## 2024-02-04 MED ORDER — ROSUVASTATIN CALCIUM 40 MG PO TABS: 40.0000 mg | ORAL_TABLET | Freq: Every day | ORAL | 2 refills | Status: AC

## 2024-02-04 NOTE — Telephone Encounter (Signed)
 Pt's medication was sent to pt's pharmacy as requested. Confirmation received.

## 2024-02-04 NOTE — Telephone Encounter (Signed)
*  STAT* If patient is at the pharmacy, call can be transferred to refill team.   1. Which medications need to be refilled? (please list name of each medication and dose if known)   rosuvastatin  (CRESTOR ) 40 MG tablet Take 1 tablet (40 mg total) by mouth daily.    4. Which pharmacy/location (including street and city if local pharmacy) is medication to be sent to? [id number] - [place], [place] - [address] AT [facility] [address]     5. Do they need a 30 day or 90 day supply? 90

## 2024-02-06 DIAGNOSIS — Z23 Encounter for immunization: Secondary | ICD-10-CM | POA: Diagnosis not present

## 2024-02-08 DIAGNOSIS — B078 Other viral warts: Secondary | ICD-10-CM | POA: Diagnosis not present

## 2024-02-20 ENCOUNTER — Encounter (HOSPITAL_COMMUNITY)

## 2024-02-20 ENCOUNTER — Telehealth: Payer: Self-pay | Admitting: Cardiology

## 2024-02-20 DIAGNOSIS — J018 Other acute sinusitis: Secondary | ICD-10-CM | POA: Diagnosis not present

## 2024-02-20 DIAGNOSIS — E119 Type 2 diabetes mellitus without complications: Secondary | ICD-10-CM | POA: Diagnosis not present

## 2024-02-20 NOTE — Telephone Encounter (Signed)
  Patient is asking if there's any other tests that he need to do. He said his wife was telling him he need an xray. He said to call him at [phone] and leave him a detailed message if he not able to answer the phone

## 2024-02-20 NOTE — Telephone Encounter (Signed)
 Called patient and made patient aware there are no further testing needed. Per pt he has two procedure scheduled on 11/17. Made patient appt to see Dr. Kate for 6 month follow up. Pt verbalized an understanding.

## 2024-02-24 DIAGNOSIS — J018 Other acute sinusitis: Secondary | ICD-10-CM | POA: Diagnosis not present

## 2024-02-24 DIAGNOSIS — E119 Type 2 diabetes mellitus without complications: Secondary | ICD-10-CM | POA: Diagnosis not present

## 2024-02-25 ENCOUNTER — Telehealth: Payer: Self-pay | Admitting: Cardiology

## 2024-02-25 DIAGNOSIS — B029 Zoster without complications: Secondary | ICD-10-CM | POA: Diagnosis not present

## 2024-02-25 NOTE — Telephone Encounter (Signed)
*  STAT* If patient is at the pharmacy, call can be transferred to refill team.   1. Which medications need to be refilled? (please list name of each medication and dose if known) pantoprazole  (PROTONIX ) 40 MG tablet    2. Would you like to learn more about the convenience, safety, & potential cost savings by using the [facility]? no   3. Are you open to using the [facility] ([facility].  ).no    4. Which pharmacy/location (including street and city if local pharmacy) is medication to be sent to? [id number] - , [place] - [address]     5. Do they need a 30 day or 90 day supply? 90 day   Medication is low

## 2024-02-26 DIAGNOSIS — B0239 Other herpes zoster eye disease: Secondary | ICD-10-CM | POA: Diagnosis not present

## 2024-02-27 ENCOUNTER — Encounter (HOSPITAL_COMMUNITY)

## 2024-03-03 NOTE — Progress Notes (Addendum)
 Jackson Porter                                          MRN: [id number]   04/08/2024   The VBCI Quality Team Specialist reviewed this patient medical record for the purposes of chart review for care gap closure. The following were reviewed: abstraction for care gap closure-glycemic status assessment.    VBCI Quality Team

## 2024-03-26 ENCOUNTER — Telehealth: Payer: Self-pay | Admitting: Cardiology

## 2024-03-26 MED ORDER — RAMIPRIL 10 MG PO CAPS: 10.0000 mg | ORAL_CAPSULE | Freq: Two times a day (BID) | ORAL | 0 refills | Status: AC

## 2024-03-26 NOTE — Telephone Encounter (Signed)
 Pt scheduled to see Dr. Kate 05/19/24.  Refill sent.

## 2024-03-26 NOTE — Telephone Encounter (Signed)
*  STAT* If patient is at the pharmacy, call can be transferred to refill team.   1. Which medications need to be refilled? (please list name of each medication and dose if known) ramipril  (ALTACE ) 10 MG capsule    2. Would you like to learn more about the convenience, safety, & potential cost savings by using the [facility]? No   3. Are you open to using the [facility] ([facility] No   4. Which pharmacy/location (including street and city if local pharmacy) is medication to be sent to? [id number] - [place], [place] - [address]   5. Do they need a 30 day or 90 day supply? 90 day

## 2024-03-31 ENCOUNTER — Ambulatory Visit (HOSPITAL_BASED_OUTPATIENT_CLINIC_OR_DEPARTMENT_OTHER)
Admission: RE | Admit: 2024-03-31 | Discharge: 2024-03-31 | Disposition: A | Discharge status: Home / Self Care | Source: Ambulatory Visit | Attending: Cardiology | Admitting: Cardiology

## 2024-03-31 ENCOUNTER — Ambulatory Visit (HOSPITAL_COMMUNITY)
Admission: RE | Admit: 2024-03-31 | Discharge: 2024-03-31 | Disposition: A | Discharge status: Home / Self Care | Source: Ambulatory Visit | Attending: Cardiology | Admitting: Cardiology

## 2024-03-31 DIAGNOSIS — I70219 Atherosclerosis of native arteries of extremities with intermittent claudication, unspecified extremity: Secondary | ICD-10-CM | POA: Diagnosis present

## 2024-03-31 DIAGNOSIS — I70213 Atherosclerosis of native arteries of extremities with intermittent claudication, bilateral legs: Secondary | ICD-10-CM | POA: Insufficient documentation

## 2024-03-31 LAB — VAS US ABI WITH/WO TBI
Left ABI: 0.98
Right ABI: 0.88

## 2024-04-18 ENCOUNTER — Other Ambulatory Visit: Payer: Self-pay | Admitting: Surgery

## 2024-04-18 ENCOUNTER — Telehealth: Payer: Self-pay | Admitting: *Deleted

## 2024-04-18 ENCOUNTER — Ambulatory Visit: Payer: Self-pay | Admitting: Surgery

## 2024-04-18 DIAGNOSIS — G8929 Other chronic pain: Secondary | ICD-10-CM

## 2024-04-18 NOTE — Telephone Encounter (Signed)
 Note has been added to the patient's appointment on 05/19/24 with Dr. Kate for pre-op clearance. Will remove from our pool.

## 2024-04-18 NOTE — Telephone Encounter (Signed)
   Pre-operative Risk Assessment    Patient Name: Jackson Porter  DOB: [birth year] MRN: [id number]   Date of last office visit: 08/21/2023 Date of next office visit: 05/19/2024 added pre op to appt notes.  Request for Surgical Clearance    Procedure:  Hernia Repair  Date of Surgery:  Clearance TBD                                 Surgeon:  Dr. Donnice Lima Surgeon's Group or Practice Name:  [facility] Phone number:  [phone] Fax number:  [phone]   Type of Clearance Requested:   - Medical  - Pharmacy:  Hold Aspirin  Not Indicated   Type of Anesthesia:  General    Additional requests/questions:    Signed, Edsel Grayce Sanders   04/18/2024, 12:38 PM

## 2024-04-18 NOTE — Telephone Encounter (Signed)
 Primary Cardiologist:Christopher LITTIE Nanas, MD  Chart reviewed as part of pre-operative protocol coverage. Because of Jackson Porter's past medical history and time since last visit, he/she will require a follow-up visit in order to better assess preoperative cardiovascular risk.  Pre-op covering staff: - Patient has an appointment on 05/19/24 with Dr. Nanas at which time clearance will be addressed. Appointment notes have been updated.  - Please contact requesting surgeon's office via preferred method (i.e, phone, fax) to inform them of need for appointment prior to surgery.  Rosaline EMERSON Bane, NP-C  04/18/2024, 3:35 PM [address], [address] [place], [place] [address] Office [phone] Fax [phone]

## 2024-05-18 NOTE — Progress Notes (Unsigned)
 " Cardiology Office Note:    Date:  05/19/2024   ID:  Jackson Porter, DOB 10/05/1950, MRN [id number]  PCP:  Leonel Cole, MD  Cardiologist:  Lonni LITTIE Nanas, MD  Electrophysiologist:  None   Referring MD: No ref. provider found   Chief Complaint  Patient presents with   Coronary Artery Disease    History of Present Illness:    Jackson Porter is a 74 y.o. male with a hx of CAD status post stenting to RCA, LAD and LCx in 2007, T2DM, former tobacco use, hypertension, hyperlipidemia who presents for follow-up.  He previously followed with Dr. Levern and Dr Ladona.  He denies any chest pain, dyspnea, lower extremity edema, or palpitations.  Reports exercise limited by knee pain but he walks 1 to 1.5 miles on treadmill about twice per week.  Does report he been having some lightheadedness but denies any syncope.  He reports having some issues with hematuria recently and went to urology.  He stopped Jardiance and symptoms resolved.    Echocardiogram 03/28/2024 showed normal biventricular function, no significant valvular disease.  ABIs 03/2024 were normal (0.98), mildly reduced on right (0.88).  Since last clinic visit, he reports he is doing okay.  Has been having issues with shingles on left side of face.  Denies any chest pain, dyspnea, lightheadedness, syncope, lower extremity edema, or palpitations.  Denies leg pain with walking. Walks on treadmill 3 days per week for 2 miles.   BP Readings from Last 3 Encounters:  05/19/24 (!) 156/78  08/21/23 138/80  12/29/22 (!) 148/79       Past Medical History:  Diagnosis Date   Coronary artery disease    Diabetes mellitus without complication (HCC)    Myocardial infarction [facility])     Past Surgical History:  Procedure Laterality Date   ABDOMINAL AORTOGRAM W/LOWER EXTREMITY N/A 12/29/2022   Procedure: ABDOMINAL AORTOGRAM W/LOWER EXTREMITY;  Surgeon: Ladona Heinz, MD;  Location: [facility];  Service: Cardiovascular;   Laterality: N/A;   APPENDECTOMY     CHOLECYSTECTOMY N/A 01/02/2022   Procedure: LAPAROSCOPIC CHOLECYSTECTOMY;  Surgeon: Mavis Anes, MD;  Location: [facility];  Service: General;  Laterality: N/A;   CORONARY STENT PLACEMENT     x3   HERNIA REPAIR Right    PERIPHERAL INTRAVASCULAR LITHOTRIPSY  12/29/2022   Procedure: PERIPHERAL INTRAVASCULAR LITHOTRIPSY;  Surgeon: Ladona Heinz, MD;  Location: [facility];  Service: Cardiovascular;;   PERIPHERAL VASCULAR BALLOON ANGIOPLASTY  12/29/2022   Procedure: PERIPHERAL VASCULAR BALLOON ANGIOPLASTY;  Surgeon: Ladona Heinz, MD;  Location: [facility];  Service: Cardiovascular;;   TONSILLECTOMY      Current Medications: Current Meds  Medication Sig   aspirin  EC 81 MG tablet Take 81 mg by mouth daily. Swallow whole.   carvedilol  (COREG ) 6.25 MG tablet Take 1 tablet (6.25 mg total) by mouth 2 (two) times daily.   Multiple Vitamin (MULTIVITAMIN) tablet Take 1 tablet by mouth daily. [employer] pack from [facility]   nitroGLYCERIN  (NITROSTAT ) 0.4 MG SL tablet Place 0.4 mg under the tongue every 5 (five) minutes as needed.   OZEMPIC, 0.25 OR 0.5 MG/DOSE, 2 MG/3ML SOPN Inject 0.25 mg into the skin once a week.   pantoprazole  (PROTONIX ) 40 MG tablet Take 40 mg by mouth daily.   ramipril  (ALTACE ) 10 MG capsule Take 1 capsule (10 mg total) by mouth 2 (two) times daily.   rosuvastatin  (CRESTOR ) 40 MG tablet Take 1 tablet (40 mg total) by  mouth daily.   [DISCONTINUED] metoprolol  succinate (TOPROL -XL) 50 MG 24 hr tablet Take 1 tablet (50 mg total) by mouth daily.     Allergies:   Fluticasone and Ibuprofen   Social History   Socioeconomic History   Marital status: Married    Spouse name: Not on file   Number of children: 1   Years of education: Not on file   Highest education level: Not on file  Occupational History   Not on file  Tobacco Use   Smoking status: Former    Current packs/day: 1.50    Average packs/day: 1.1 packs/day for 61.4 years (70.4  ttl pk-yrs)    Types: Cigarettes    Start date: 12/27/1962   Smokeless tobacco: Never  Vaping Use   Vaping status: Not on file  Substance and Sexual Activity   Alcohol use: No   Drug use: No   Sexual activity: Not on file  Other Topics Concern   Not on file  Social History Narrative   Not on file   Social Drivers of Health   Tobacco Use: Medium Risk (04/18/2024)   Received from [facility]   Patient History    Smoking Tobacco Use: Former    Smokeless Tobacco Use: Unknown    Passive Exposure: Not on file  Financial Resource Strain: Not on file  Food Insecurity: Low Risk (04/21/2024)   Received from [facility]   Epic    Within the past 12 months, you worried that your food would run out before you got money to buy more: Never true    Within the past 12 months, the food you bought just didn't last and you didn't have money to get more. : Never true  Transportation Needs: No Transportation Needs (04/21/2024)   Received from [employer]    In the past 12 months, has lack of reliable transportation kept you from medical appointments, meetings, work or from getting things needed for daily living? : No  Physical Activity: Not on file  Stress: Not on file  Social Connections: Unknown (09/23/2021)   Received from [facility]   Social Network    Social Network: Not on file  Depression (PHQ2-9): Not on file  Alcohol Screen: Not on file  Housing: Low Risk (04/21/2024)   Received from [facility]   Epic    What is your living situation today?: I have a steady place to live    Think about the place you live. Do you have problems with any of the following? Choose all that apply:: None/None on this list  Utilities: Low Risk (04/21/2024)   Received from [facility]   Utilities    In the past 12 months has the electric, gas, oil, or water company threatened to shut off services in your home? : No  Health Literacy: Not on file     Family  History: The patient's family history includes Heart attack in his brother.  ROS:   Please see the history of present illness.     All other systems reviewed and are negative.  EKGs/Labs/Other Studies Reviewed:    The following studies were reviewed today:   EKG:   08/21/2023: Normal sinus rhythm, right bundle branch block, left anterior fascicular block, inferior Q waves  Recent Labs: No results found for requested labs within last 365 days.  Recent Lipid Panel No results found for: CHOL, TRIG, HDL, CHOLHDL, VLDL, LDLCALC, LDLDIRECT  Physical Exam:    VS:  BP ROLLEN)  156/78 (BP Location: Left Arm, Patient Position: Sitting, Cuff Size: Normal)   Pulse 65   Resp (!) 96   Ht 5' 8 (1.727 m)   Wt 208 lb (94.3 kg)   BMI 31.63 kg/m     Wt Readings from Last 3 Encounters:  05/19/24 208 lb (94.3 kg)  08/21/23 213 lb 3.2 oz (96.7 kg)  12/29/22 205 lb (93 kg)     GEN:  Well nourished, well developed in no acute distress HEENT: Normal NECK: No JVD; No carotid bruits LYMPHATICS: No lymphadenopathy CARDIAC: RRR, no murmurs, rubs, gallops RESPIRATORY:  Clear to auscultation without rales, wheezing or rhonchi  ABDOMEN: Soft, non-tender, non-distended MUSCULOSKELETAL:  No edema; No deformity  SKIN: Warm and dry NEUROLOGIC:  Alert and oriented x 3 PSYCHIATRIC:  Normal affect   ASSESSMENT:    1. Coronary artery disease involving native coronary artery of native heart without angina pectoris   2. PAD (peripheral artery disease)   3. Essential hypertension   4. Hyperlipidemia, unspecified hyperlipidemia type     PLAN:    CAD: status post stenting to RCA, LAD and LCx in 2007.  Denies anginal symptoms. Echocardiogram 03/28/2024 showed normal biventricular function - Continue aspirin  81 mg daily - Continue rosuvastatin  40 mg daily   PAD: Underwent peripheral angiography 12/2022 which showed 60% stenosis proximal right CIA, 40% distal left CIA, 80% left SFA, 50% right  SFA, 100% left ATA, 100% right ATA; underwent angioplasty to left SFA.  ABIs 02/2023 were normal (right 0.96, left 1.03). ABIs 03/2024 were normal on left (0.98), mildly reduced on right (0.88).  LE arterial duplex 03/2024 showed 50-74% in right SFA, 30-49% stenosis in left SFA. - Continue aspirin  - Continue statin  AAA: Infrarenal abdominal aortic aneurysm measuring 3.3 cm on CT abdomen 10/2022.  Follow-up ultrasound recommended in 3 years.  Mitral annular calcifications: Significant MAC noted on CT chest.  Echocardiogram 09/2023 showed moderate MAC but no mitral stenosis, trivial mitral regurgitation  Hypertension: On ramipril  10 mg twice daily and Toprol -XL 50 mg daily.  BP elevated, will switch from Toprol -XL to carvedilol  625 mg twice daily.  Asked to check BP twice daily for next week and let us  know results  Hyperlipidemia: On rosuvastatin  40 mg daily.  LDL 38 on 05/24/2023.  Update lipid panel and will check LP(a)  T2DM: On Ozempic.  A1c 6.7% on 04/2024  OSA: reports inability to tolerate CPAP, follows with sleep medicine  RTC in 6 months   Medication Adjustments/Labs and Tests Ordered: Current medicines are reviewed at length with the patient today.  Concerns regarding medicines are outlined above.  Orders Placed This Encounter  Procedures   Basic Metabolic Panel (BMET)   Lipid panel   Lipoprotein A (LPA)   Meds ordered this encounter  Medications   carvedilol  (COREG ) 6.25 MG tablet    Sig: Take 1 tablet (6.25 mg total) by mouth 2 (two) times daily.    Dispense:  180 tablet    Refill:  3    Patient Instructions  Medication Instructions:  STOP Metoprolol  Succinate (Toprol  XL).  START taking Carvedilol  (Coreg ) 6.26 mg. Take one (1) tablet by mouth twice daily.  *If you need a refill on your cardiac medications before your next appointment, please call your pharmacy*  Lab Work: BMP, Lipid Panel, Lipoprotein (a) If you have labs (blood work) drawn today and your tests are  completely normal, you will receive your results only by: MyChart Message (if you have MyChart) OR A paper  copy in the mail If you have any lab test that is abnormal or we need to change your treatment, we will call you to review the results.  Testing/Procedures: None ordered  Follow-Up: At [facility], you and your health needs are our priority.  As part of our continuing mission to provide you with exceptional heart care, our providers are all part of one team.  This team includes your primary Cardiologist (physician) and Advanced Practice Providers or APPs (Physician Assistants and Nurse Practitioners) who all work together to provide you with the care you need, when you need it.  Your next appointment:   6 month(s)  Provider:   Lonni LITTIE Nanas, MD    We recommend signing up for the patient portal called MyChart.  Sign up information is provided on this After Visit Summary.  MyChart is used to connect with patients for Virtual Visits (Telemedicine).  Patients are able to view lab/test results, encounter notes, upcoming appointments, etc.  Non-urgent messages can be sent to your provider as well.   To learn more about what you can do with MyChart, go to [email or web address].   Other Instructions Your physician has requested that you regularly monitor and record your blood pressure readings at home for two (2) weeks. Please use the same machine at the same time of day to check your readings and record them and send them via MyChart message    Please monitor blood pressures and keep a log of your readings.    Make sure to check 2 hours after your medications.    AVOID these things for 30 minutes before checking your blood pressure: No Drinking caffeine. No Drinking alcohol. No Eating. No Smoking. No Exercising.   Five minutes before checking your blood pressure: Pee. Sit in a dining chair. Avoid sitting in a soft couch or armchair. Be quiet. Do not  talk            Signed, Lonni LITTIE Nanas, MD  05/19/2024 9:15 AM    [facility] "

## 2024-05-19 ENCOUNTER — Ambulatory Visit: Attending: Cardiology | Admitting: Cardiology

## 2024-05-19 VITALS — BP 156/78 | HR 65 | Resp 96 | Ht 68.0 in | Wt 208.0 lb

## 2024-05-19 DIAGNOSIS — I2510 Atherosclerotic heart disease of native coronary artery without angina pectoris: Secondary | ICD-10-CM

## 2024-05-19 DIAGNOSIS — I10 Essential (primary) hypertension: Secondary | ICD-10-CM

## 2024-05-19 DIAGNOSIS — E785 Hyperlipidemia, unspecified: Secondary | ICD-10-CM

## 2024-05-19 DIAGNOSIS — I739 Peripheral vascular disease, unspecified: Secondary | ICD-10-CM | POA: Diagnosis not present

## 2024-05-19 MED ORDER — CARVEDILOL 6.25 MG PO TABS: 6.2500 mg | ORAL_TABLET | Freq: Two times a day (BID) | ORAL | 3 refills | Status: AC

## 2024-05-19 NOTE — Patient Instructions (Signed)
 Medication Instructions:  STOP Metoprolol  Succinate (Toprol  XL).  START taking Carvedilol  (Coreg ) 6.26 mg. Take one (1) tablet by mouth twice daily.  *If you need a refill on your cardiac medications before your next appointment, please call your pharmacy*  Lab Work: BMP, Lipid Panel, Lipoprotein (a) If you have labs (blood work) drawn today and your tests are completely normal, you will receive your results only by: MyChart Message (if you have MyChart) OR A paper copy in the mail If you have any lab test that is abnormal or we need to change your treatment, we will call you to review the results.  Testing/Procedures: None ordered  Follow-Up: At [facility], you and your health needs are our priority.  As part of our continuing mission to provide you with exceptional heart care, our providers are all part of one team.  This team includes your primary Cardiologist (physician) and Advanced Practice Providers or APPs (Physician Assistants and Nurse Practitioners) who all work together to provide you with the care you need, when you need it.  Your next appointment:   6 month(s)  Provider:   Lonni LITTIE Nanas, MD    We recommend signing up for the patient portal called MyChart.  Sign up information is provided on this After Visit Summary.  MyChart is used to connect with patients for Virtual Visits (Telemedicine).  Patients are able to view lab/test results, encounter notes, upcoming appointments, etc.  Non-urgent messages can be sent to your provider as well.   To learn more about what you can do with MyChart, go to [email or web address].   Other Instructions Your physician has requested that you regularly monitor and record your blood pressure readings at home for two (2) weeks. Please use the same machine at the same time of day to check your readings and record them and send them via MyChart message    Please monitor blood pressures and keep a log of your readings.     Make sure to check 2 hours after your medications.    AVOID these things for 30 minutes before checking your blood pressure: No Drinking caffeine. No Drinking alcohol. No Eating. No Smoking. No Exercising.   Five minutes before checking your blood pressure: Pee. Sit in a dining chair. Avoid sitting in a soft couch or armchair. Be quiet. Do not talk

## 2024-05-20 ENCOUNTER — Ambulatory Visit: Payer: Self-pay | Admitting: Cardiology

## 2024-05-20 LAB — BASIC METABOLIC PANEL WITH GFR
BUN/Creatinine Ratio: 9 — ABNORMAL LOW (ref 10–24)
BUN: 10 mg/dL (ref 8–27)
CO2: 23 mmol/L (ref 20–29)
Calcium: 9.5 mg/dL (ref 8.6–10.2)
Chloride: 103 mmol/L (ref 96–106)
Creatinine, Ser: 1.08 mg/dL (ref 0.76–1.27)
Glucose: 110 mg/dL — ABNORMAL HIGH (ref 70–99)
Potassium: 4.3 mmol/L (ref 3.5–5.2)
Sodium: 141 mmol/L (ref 134–144)
eGFR: 73 mL/min/1.73

## 2024-05-20 LAB — LIPID PANEL
Chol/HDL Ratio: 2.8 ratio (ref 0.0–5.0)
Cholesterol, Total: 110 mg/dL (ref 100–199)
HDL: 39 mg/dL — ABNORMAL LOW
LDL Chol Calc (NIH): 49 mg/dL (ref 0–99)
Triglycerides: 123 mg/dL (ref 0–149)
VLDL Cholesterol Cal: 22 mg/dL (ref 5–40)

## 2024-05-20 LAB — LIPOPROTEIN A (LPA): Lipoprotein (a): 72.5 nmol/L

## 2024-06-12 ENCOUNTER — Ambulatory Visit: Payer: Self-pay | Admitting: Surgery

## 2024-06-12 NOTE — Progress Notes (Signed)
 Jackson Porter                                          MRN: [id number]   06/12/2024   The VBCI Quality Team Specialist reviewed this patient medical record for the purposes of chart review for care gap closure. The following were reviewed: abstraction for care gap closure-glycemic status assessment.    VBCI Quality Team

## 2024-06-12 NOTE — H&P (Signed)
 "  Subjective   Chief Complaint: Recheck hernia  Cardiology - Dr. Kate PCP - Dr. Cheryle Frees  History of Present Illness: Jackson Porter is a 74 y.o. male who is seen today as an office consultation at the request of Dr. Frees for evaluation of hernia .    This is a 74 year old male who presents with discomfort in his right groin.  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  He was examined and was diagnosed with a right inguinal hernia.  He is now referred to us  to discuss hernia repair.  In June 2009, I repaired an umbilical hernia and a right inguinal hernia.  At that time, he had a moderate-sized direct hernia as well as a small indirect hernia.  The floor of the inguinal canal was reapproximated with PDS suture.  We repaired the hernia with UltraPro mesh.  The umbilical hernia was repaired with Ventralex mesh.  There has been some delay in scheduling the appointment in December 2025 due to [employer] issues.  The patient has fairly persistent pain that is occasionally exacerbated by movement.  He denies any bulging.  He denies any significant change in his bowel movements although he does occasionally have some constipation secondary to Ozempic.  The patient had a urologic procedure for prostatic hypertrophy a couple of years ago.  At the last visit, the patient had significant tenderness at the right external inguinal ring.  I did not feel an obvious hernia.  We obtained a CT scan that was finally performed at an [facility] facility.  This did not show an obvious recurrence on the right side.  Review of Systems: A complete review of systems was obtained from the patient.  I have reviewed this information and discussed as appropriate with the patient.  See HPI as well for other ROS.  Review of Systems  Constitutional: Negative.   HENT: Negative.    Eyes: Negative.   Respiratory: Negative.    Cardiovascular: Negative.    Gastrointestinal:  Positive for abdominal pain.  Genitourinary: Negative.   Musculoskeletal: Negative.   Skin: Negative.   Neurological: Negative.   Endo/Heme/Allergies: Negative.   Psychiatric/Behavioral: Negative.        Medical History: Past Medical History:  Diagnosis Date   Arthritis    CHF (congestive heart failure) (CMS/HHS-HCC)    Diabetes mellitus without complication (CMS/HHS-HCC)    Hyperlipidemia    Hypertension     Patient Active Problem List  Diagnosis   Atherosclerotic peripheral vascular disease with intermittent claudication ()   Empyema of gallbladder   Hypokalemia   Infrarenal abdominal aortic aneurysm (AAA) without rupture ()   Enlarged prostate with urinary obstruction    Past Surgical History:  Procedure Laterality Date   left knee surgery  2010   HERNIA REPAIR       Allergies  Allergen Reactions   Fluticasone Hives and Itching    hives  fluticasone   Ibuprofen Hives, Other (See Comments) and Unknown    Hives in high dosage  Unknown  High doses not over the counter  unknown  Unknown, High doses not over the counter  ibuprofen    Current Outpatient Medications on File Prior to Visit  Medication Sig Dispense Refill   aspirin  81 MG chewable tablet Take 81 mg by mouth once daily     empagliflozin (JARDIANCE) 10 mg tablet Jardiance 10 mg tablet     glimepiride (AMARYL) 4 MG tablet  metoprolol  succinate (TOPROL -XL) 25 MG XL tablet metoprolol  succinate ER 25 mg tablet,extended release 24 hr     pantoprazole  (PROTONIX ) 40 MG DR tablet Take 40 mg by mouth once daily     ramipriL  (ALTACE ) 10 MG capsule ramipril  10 mg capsule     rosuvastatin  (CRESTOR ) 20 MG tablet rosuvastatin  20 mg tablet     No current facility-administered medications on file prior to visit.    History reviewed. No pertinent family history.   Social History   Tobacco Use  Smoking Status Former   Types: Cigarettes  Smokeless Tobacco Not on file      Social History   Socioeconomic History   Marital status: Married  Tobacco Use   Smoking status: Former    Types: Cigarettes  Vaping Use   Vaping status: Never Used  Substance and Sexual Activity   Alcohol use: Never   Drug use: Never   Sexual activity: Yes    Partners: Female   Social Drivers of Health   Food Insecurity: Low Risk (04/21/2024)   Received from [facility]   Hunger Vital Sign    Within the past 12 months, you worried that your food would run out before you got money to buy more: Never true    Within the past 12 months, the food you bought just didn't last and you didn't have money to get more. : Never true  Transportation Needs: No Transportation Needs (04/21/2024)   Received from [employer]    In the past 12 months, has lack of reliable transportation kept you from medical appointments, meetings, work or from getting things needed for daily living? : No   Received from [employer]   Social Network  Housing Stability: Low Risk (04/21/2024)   Received from [employer] Stability Vital Sign    What is your living situation today?: I have a steady place to live    Think about the place you live. Do you have problems with any of the following? Choose all that apply:: None/None on this list    Objective:   Physical Exam   Constitutional:  WDWN in NAD, conversant, no obvious deformities; lying in bed comfortably Eyes:  Pupils equal, round; sclera anicteric; moist conjunctiva; no lid lag HENT:  Oral mucosa moist; good dentition  Neck:  No masses palpated, trachea midline; no thyromegaly Lungs:  CTA bilaterally; normal respiratory effort CV:  Regular rate and rhythm; no murmurs; extremities well-perfused with no edema Abd:  +bowel sounds, soft, non-tender, no palpable organomegaly; no palpable hernias GU: Bilateral descended testes, no testicular masses, no palpable inguinal hernia on left side when standing with Valsalva  maneuver.  Compared to the last visit, the patient now appears to have a small spontaneously reducible bulge at the right inguinal ring.  The patient is significantly tender at the right external inguinal ring. Musc: Normal gait; no apparent clubbing or cyanosis in extremities Lymphatic:  No palpable cervical or axillary lymphadenopathy Skin:  Warm, dry; no sign of jaundice Psychiatric - alert and oriented x 4; calm mood and affect   CT ABDOMEN PELVIS W CONTRAST, 05/26/2024 9:45 AM  INDICATION: Right lower quadrant pain \ R10.31 Right lower quadrant pain COMPARISON: Outside facility CT CAP 10/21/2022 (report only).  TECHNIQUE: CT images of the abdomen and pelvis were obtained after intravenous administration of iodinated contrast. Conventional axial reconstructions and multiplanar reformatted images were submitted for review.  FINDINGS:  . Lower Chest: Partially imaged coronary artery,  mitral valve and aortic annulus calcifications. Small hiatal hernia. Trace pericardial effusion. No consolidation or pleural effusion.  . Liver: No suspicious focal findings. . Gallbladder/Biliary: Cholecystectomy. 7 mm high density structure likely represents surgical clip (series 4, image 48). SABRA Spleen: Unremarkable. . Pancreas: Unremarkable. . Adrenals: Unremarkable. . Kidneys: No hydronephrosis. Symmetric enhancement. 1.1 cm cortical cyst in interpolar region of the left kidney.  . Peritoneum/Mesenteries/Extraperitoneum: No free air. No free fluid or loculated drainable collection. No pathologically enlarged lymph nodes. . Gastrointestinal tract: No evidence of obstruction. Colonic diverticulosis, without diverticulitis. Appendectomy.  . Ureters: Unremarkable. . Bladder: Concentric bladder wall thickening, likely due to chronic outlet obstruction. . Reproductive System: Prostatomegaly, transverse dimension of 5.2 cm. Changes of UroLift.  . Vascular: Aortobiiliac calcified atherosclerosis. 3.3 cm  infrarenal abdominal aortic aneurysm (series 2, image 68). . Musculoskeletal: No acute displaced fractures. Multilevel degenerative changes of the spine. No aggressive focal bony lesions. Ventral abdominal wall surgical changes. Changes of right inguinal hernia repair, without hernia recurrence.  IMPRESSION: 1.  No acute findings within the imaged abdomen or pelvis. 2.  Similar size of infrarenal abdominal aortic aneurysm. 3.  Stable ancillary findings as above. Exam End: 05/26/24 09:45    Assessment and Plan:  Diagnoses and all orders for this visit:  Recurrent right inguinal hernia  The patient seems to be more symptomatic and now has a small palpable bulge when standing with Valsalva maneuver.  This was not obvious on the CT scan which was a supine CT scan.  Recommend open repair of recurrent right inguinal hernia with mesh.  I explained to him that we could possibly use his old mesh if it had not been displaced or we might possibly have to use a new piece of mesh.  The surgical procedure has been discussed with the patient.  Potential risks, benefits, alternative treatments, and expected outcomes have been explained.  All of the patient's questions at this time have been answered.  The likelihood of reaching the patient's treatment goal is good.  The patient understands the proposed surgical procedure and wishes to proceed.    DONNICE DEWAYNE LIMA, MD  06/12/2024 4:35 PM "

## 2024-06-13 ENCOUNTER — Telehealth (HOSPITAL_BASED_OUTPATIENT_CLINIC_OR_DEPARTMENT_OTHER): Payer: Self-pay

## 2024-06-13 NOTE — Telephone Encounter (Signed)
"  ° °  Pre-operative Risk Assessment    Patient Name: Jackson Porter  DOB: 03-16-1951 MRN: [id number]   Date of last office visit: 05/19/24 with Kate Date of next office visit: NA  Request for Surgical Clearance    Procedure:  Open hernia repair   Date of Surgery:  Clearance TBD                                 Surgeon:  Dr. Belinda Socks Group or Practice Name:  [facility] Phone number:  [phone] Fax number:  [phone]   Type of Clearance Requested:   - Medical  - Pharmacy:  Hold Aspirin  not indicated    Type of Anesthesia:  General    Additional requests/questions:    SignedAugustin JONETTA Daring   06/13/2024, 7:36 AM   "

## 2024-06-13 NOTE — Telephone Encounter (Signed)
 Dr. Kate,  Mr. Southwell is requesting preoperative cardiac evaluation for open hernia repair.  Procedure has not yet been scheduled.  He was recently seen by you in clinic on 05/19/2024.  He was stable from a cardiac standpoint.  His blood pressure was elevated.  His metoprolol  was transition to carvedilol .  He was instructed to monitor his blood pressure.  Return office visit in 6 months was planned.  Would you be able to comment on cardiac risk for upcoming surgery?  Thank you for your help.  Please direct your response to [facility] preop pool.

## 2024-07-09 ENCOUNTER — Ambulatory Visit (HOSPITAL_COMMUNITY): Admit: 2024-07-09 | Admitting: Surgery
